# Patient Record
Sex: Female | Born: 1935
Health system: Southern US, Community
[De-identification: ages and names within clinical notes are randomized; demographics above are authoritative.]

## PROBLEM LIST (undated history)

## (undated) DIAGNOSIS — M8580 Other specified disorders of bone density and structure, unspecified site: Secondary | ICD-10-CM

## (undated) DIAGNOSIS — C4490 Unspecified malignant neoplasm of skin, unspecified: Secondary | ICD-10-CM

## (undated) DIAGNOSIS — E785 Hyperlipidemia, unspecified: Secondary | ICD-10-CM

## (undated) DIAGNOSIS — IMO0001 Reserved for inherently not codable concepts without codable children: Secondary | ICD-10-CM

## (undated) DIAGNOSIS — I471 Supraventricular tachycardia, unspecified: Secondary | ICD-10-CM

## (undated) DIAGNOSIS — I10 Essential (primary) hypertension: Secondary | ICD-10-CM

## (undated) HISTORY — DX: Reserved for inherently not codable concepts without codable children: IMO0001

## (undated) HISTORY — PX: OTHER SURGICAL HISTORY: SHX169

## (undated) HISTORY — DX: Essential (primary) hypertension: I10

## (undated) HISTORY — DX: Hyperlipidemia, unspecified: E78.5

## (undated) HISTORY — DX: Supraventricular tachycardia, unspecified: I47.10

## (undated) HISTORY — DX: Unspecified malignant neoplasm of skin, unspecified: C44.90

## (undated) HISTORY — PX: CATARACT EXTRACTION: SUR2

## (undated) HISTORY — DX: Other specified disorders of bone density and structure, unspecified site: M85.80

## (undated) HISTORY — DX: Supraventricular tachycardia: I47.1

---
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
## 1997-12-04 ENCOUNTER — Other Ambulatory Visit: Admission: RE | Admit: 1997-12-04 | Discharge: 1997-12-04 | Payer: Self-pay | Admitting: *Deleted

## 1998-07-17 ENCOUNTER — Encounter: Payer: Self-pay | Admitting: *Deleted

## 1998-07-17 ENCOUNTER — Ambulatory Visit (HOSPITAL_COMMUNITY): Admission: RE | Admit: 1998-07-17 | Discharge: 1998-07-17 | Payer: Self-pay | Admitting: *Deleted

## 1998-12-05 ENCOUNTER — Other Ambulatory Visit: Admission: RE | Admit: 1998-12-05 | Discharge: 1998-12-05 | Payer: Self-pay | Admitting: *Deleted

## 1999-07-21 ENCOUNTER — Ambulatory Visit (HOSPITAL_COMMUNITY): Admission: RE | Admit: 1999-07-21 | Discharge: 1999-07-21 | Payer: Self-pay | Admitting: *Deleted

## 2000-08-10 ENCOUNTER — Encounter (INDEPENDENT_AMBULATORY_CARE_PROVIDER_SITE_OTHER): Payer: Self-pay | Admitting: Specialist

## 2000-08-10 ENCOUNTER — Ambulatory Visit (HOSPITAL_COMMUNITY): Admission: RE | Admit: 2000-08-10 | Discharge: 2000-08-10 | Payer: Self-pay | Admitting: Gastroenterology

## 2000-08-12 ENCOUNTER — Encounter: Payer: Self-pay | Admitting: Family Medicine

## 2000-08-12 ENCOUNTER — Ambulatory Visit (HOSPITAL_COMMUNITY): Admission: RE | Admit: 2000-08-12 | Discharge: 2000-08-12 | Payer: Self-pay | Admitting: Family Medicine

## 2001-08-15 ENCOUNTER — Encounter: Payer: Self-pay | Admitting: Family Medicine

## 2001-08-15 ENCOUNTER — Ambulatory Visit (HOSPITAL_COMMUNITY): Admission: RE | Admit: 2001-08-15 | Discharge: 2001-08-15 | Payer: Self-pay | Admitting: Family Medicine

## 2007-10-05 ENCOUNTER — Encounter: Admission: RE | Admit: 2007-10-05 | Discharge: 2008-01-03 | Payer: Self-pay | Admitting: Family Medicine

## 2009-09-26 ENCOUNTER — Encounter: Admission: RE | Admit: 2009-09-26 | Discharge: 2009-10-28 | Payer: Self-pay | Admitting: Orthopedic Surgery

## 2010-07-04 NOTE — Procedures (Signed)
[facility]  Patient:    Linda Gates, Linda Gates                  MRN: [id number] Proc. Date: 08/10/00 Adm. Date:  [id number] Attending:  Louie Bun CC:         Dellis Anes. Idell Pickles, M.D.   Procedure Report  PROCEDURE:  Colonoscopy with polypectomy.  ENDOSCOPIST:  Everardo All. Madilyn Fireman, M.D.  INDICATIONS:  Colon cancer screening in a 77 year old patient with no previous colon imaging.  DESCRIPTION OF PROCEDURE:  The patient was placed in the left lateral decubitus position and placed on the pulse monitor with continuous low flow oxygen delivered by nasal cannula.  She was sedated with 60 mg of IV Demerol and 6 mg of IV Versed.  The Olympus video colonoscope was inserted into the rectum and advanced to the cecum, confirmed by transillumination of McBurneys point and visualization of the ileocecal valve and appendiceal orifice.  The prep was fairly good.  The cecum, ascending and transverse colon appeared normal with no masses, polyps, diverticula or other mucosal abnormalities. Within the descending colon, we saw a flat 6 mm sessile polyp with was fulgurated by hot biopsy.  The remainder of the descending colon revealed no other abnormalities.  Within the sigmoid colon there was seen a few sigmoid diverticula and no other abnormalities.  The rectum appeared normal, and retroflexed view of the anus revealed no obviously enlarged internal hemorrhoids.  The colonoscope was then withdrawn, and the patient returned to the recovery room in stable condition.  She tolerated the procedure well, and there were no immediate complications.  IMPRESSION: 1. Single small descending colon polyp. 2. Left-sided diverticulosis.  PLAN:  Await histology to determine and interval for future colon screening.   and descending colon all appeared normal with no masses, polyps, diverticula or other mucosal abnormalities.  In the sigmoid colon there were seen several scattered  diverticula, no other abnormalities.  The rectum appeared normal and retroflexed view of the anus revealed no obvious internal hemorrhoids. The colonoscope was then withdrawn, and the patient returned to the recovery room in stable condition.  He tolerated the procedure well.  There were no immediate complications.  IMPRESSION:  Diverticulosis, otherwise normal colonoscopy.DD:  08/10/00 TD:  08/10/00 Job: [id number]

## 2013-01-05 ENCOUNTER — Telehealth: Payer: Self-pay

## 2013-01-05 MED ORDER — LISINOPRIL 5 MG PO TABS: 5.0000 mg | ORAL_TABLET | Freq: Every day | ORAL | Status: DC

## 2013-01-05 NOTE — Telephone Encounter (Signed)
Rx sent in for pt.

## 2013-01-07 ENCOUNTER — Encounter: Payer: Self-pay | Admitting: *Deleted

## 2013-01-07 ENCOUNTER — Encounter: Payer: Self-pay | Admitting: Cardiology

## 2013-01-07 DIAGNOSIS — I471 Supraventricular tachycardia: Secondary | ICD-10-CM | POA: Insufficient documentation

## 2013-01-07 DIAGNOSIS — IMO0001 Reserved for inherently not codable concepts without codable children: Secondary | ICD-10-CM | POA: Insufficient documentation

## 2013-01-07 DIAGNOSIS — I10 Essential (primary) hypertension: Secondary | ICD-10-CM | POA: Insufficient documentation

## 2013-01-07 DIAGNOSIS — M8580 Other specified disorders of bone density and structure, unspecified site: Secondary | ICD-10-CM | POA: Insufficient documentation

## 2013-01-07 DIAGNOSIS — C4490 Unspecified malignant neoplasm of skin, unspecified: Secondary | ICD-10-CM | POA: Insufficient documentation

## 2013-01-07 DIAGNOSIS — E785 Hyperlipidemia, unspecified: Secondary | ICD-10-CM | POA: Insufficient documentation

## 2013-01-10 ENCOUNTER — Ambulatory Visit: Payer: Self-pay | Admitting: Cardiology

## 2013-01-16 ENCOUNTER — Other Ambulatory Visit: Payer: Self-pay | Admitting: *Deleted

## 2013-01-16 ENCOUNTER — Encounter: Payer: Self-pay | Admitting: Cardiology

## 2013-01-16 ENCOUNTER — Ambulatory Visit (INDEPENDENT_AMBULATORY_CARE_PROVIDER_SITE_OTHER): Payer: Medicare Other | Admitting: Cardiology

## 2013-01-16 VITALS — BP 166/78 | HR 64 | Ht 62.0 in | Wt 150.0 lb

## 2013-01-16 DIAGNOSIS — I471 Supraventricular tachycardia: Secondary | ICD-10-CM

## 2013-01-16 DIAGNOSIS — I498 Other specified cardiac arrhythmias: Secondary | ICD-10-CM

## 2013-01-16 DIAGNOSIS — I10 Essential (primary) hypertension: Secondary | ICD-10-CM

## 2013-01-16 NOTE — Progress Notes (Signed)
  [address] [place], [place]  [address] Phone: [phone] Fax:  [phone]  Date:  01/16/2013   ID:  Linda Gates, DOB September 09, 1929, MRN [id number]  PCP:  Gaye Alken, MD  Cardiologist:  Armanda Magic, MD     History of Present Illness: Linda Gates is a 79 y.o. female with a history of SVT and HTN who presents today for followup.  She denies any rapid heart beats but is aware of her heart beat at night at times.  She denies any chest pain, SOB, DOE, LE edema, dizziness or syncope.   Wt Readings from Last 3 Encounters:  01/16/13 150 lb (68.04 kg)     Past Medical History  Diagnosis Date  . HTN (hypertension)   . Paroxysmal supraventricular tachycardia   . Myalgia and myositis, unspecified   . Osteopenia   . SVT (supraventricular tachycardia)   . Dyslipidemia   . Skin cancer   . Hyperlipidemia     Current Outpatient Prescriptions  Medication Sig Dispense Refill  . Biotin 5000 MCG CAPS Take by mouth daily.      Marland Kitchen aspirin 81 MG tablet Take 81 mg by mouth daily.      . Cholecalciferol (VITAMIN D) 400 UNITS capsule Take 400 Units by mouth daily.      Marland Kitchen diltiazem (DILACOR XR) 240 MG 24 hr capsule Take 240 mg by mouth daily.      Marland Kitchen lisinopril (PRINIVIL,ZESTRIL) 5 MG tablet Take 1 tablet (5 mg total) by mouth daily.  30 tablet  6  . metoprolol (LOPRESSOR) 50 MG tablet Take 50 mg by mouth 2 (two) times daily.      . Omega-3 Fatty Acids (FISH OIL PO) Take 1 tablet by mouth daily.      . vitamin C (ASCORBIC ACID) 500 MG tablet Take 500 mg by mouth daily.       No current facility-administered medications for this visit.    Allergies:   Allergies not on file  Social History:  The patient  reports that she has never smoked. She does not have any smokeless tobacco history on file. She reports that she does not drink alcohol or use illicit drugs.   Family History:  The patient's family history includes Heart attack in her mother; Heart  disease in her father; Hypertension in her father.   ROS:  Please see the history of present illness.      All other systems reviewed and negative.   PHYSICAL EXAM: VS:  BP 166/78  Pulse 64  Ht 5\' 2"  (1.575 m)  Wt 150 lb (68.04 kg)  BMI 27.43 kg/m2 Well nourished, well developed, in no acute distress HEENT: normal Neck: no JVD Cardiac:  normal S1, S2; RRR; no murmur Lungs:  clear to auscultation bilaterally, no wheezing, rhonchi or rales Abd: soft, nontender, no hepatomegaly Ext: no edema Skin: warm and dry Neuro:  CNs 2-12 intact, no focal abnormalities noted       ASSESSMENT AND PLAN:  1. SVT with no rapid heart beats   - continue Diltiazem/metoprolol  2. HTN with elevated BP today - she brings in her BP readings today which range from 104-138/45-55mmHg.    - will continue diltiazem/metoprolol/lisinopril  Followup with me in 1 year  Signed, Armanda Magic, MD 01/16/2013 8:53 AM

## 2013-01-16 NOTE — Patient Instructions (Signed)
Your physician recommends that you continue on your current medications as directed. Please refer to the Current Medication list given to you today.  Your physician wants you to follow-up in: 1 Year with Dr Turner You will receive a reminder letter in the mail two months in advance. If you don't receive a letter, please call our office to schedule the follow-up appointment.  

## 2013-07-19 ENCOUNTER — Other Ambulatory Visit: Payer: Self-pay | Admitting: Family Medicine

## 2013-07-19 ENCOUNTER — Ambulatory Visit
Admission: RE | Admit: 2013-07-19 | Discharge: 2013-07-19 | Disposition: A | Discharge status: Home / Self Care | Payer: Medicare Other | Source: Ambulatory Visit | Attending: Family Medicine | Admitting: Family Medicine

## 2013-07-19 DIAGNOSIS — R609 Edema, unspecified: Secondary | ICD-10-CM

## 2013-07-19 DIAGNOSIS — M79604 Pain in right leg: Secondary | ICD-10-CM

## 2014-01-17 ENCOUNTER — Ambulatory Visit (INDEPENDENT_AMBULATORY_CARE_PROVIDER_SITE_OTHER): Payer: Medicare Other | Admitting: Cardiology

## 2014-01-17 ENCOUNTER — Encounter: Payer: Self-pay | Admitting: Cardiology

## 2014-01-17 VITALS — BP 150/72 | HR 62 | Ht 62.0 in | Wt 151.0 lb

## 2014-01-17 DIAGNOSIS — I471 Supraventricular tachycardia: Secondary | ICD-10-CM

## 2014-01-17 DIAGNOSIS — R002 Palpitations: Secondary | ICD-10-CM

## 2014-01-17 DIAGNOSIS — I10 Essential (primary) hypertension: Secondary | ICD-10-CM

## 2014-01-17 NOTE — Progress Notes (Signed)
[facility] [place] [place], [place]  [address] Phone: [phone] Fax:  [phone]  Date:  01/17/2014   ID:  Linda Gates, DOB [birth year], MRN [id number]  PCP:  Gerrit Heck, MD  Cardiologist:  Fransico Him, MD    History of Present Illness: Linda Gates is a 80 y.o. female with a history of SVT and HTN who presents today for followup. She denies any rapid heart beats but is aware of her heart beating hard and this occurs both at night and during the day. She denies any chest pain, SOB, DOE, dizziness or syncope.  She has had some RLE edema after pulling a muscle which has resolved.    Wt Readings from Last 3 Encounters:  01/17/14 151 lb (68.493 kg)  01/16/13 150 lb (68.04 kg)     Past Medical History  Diagnosis Date  . HTN (hypertension)   . Paroxysmal supraventricular tachycardia   . Myalgia and myositis, unspecified   . Osteopenia   . SVT (supraventricular tachycardia)   . Dyslipidemia   . Skin cancer   . Hyperlipidemia     Current Outpatient Prescriptions  Medication Sig Dispense Refill  . aspirin 81 MG tablet Take 81 mg by mouth daily.    . Biotin 5000 MCG CAPS Take by mouth daily.    . Cholecalciferol (VITAMIN D) 400 UNITS capsule Take 400 Units by mouth daily.    Marland Kitchen diltiazem (DILACOR XR) 240 MG 24 hr capsule Take 240 mg by mouth daily.    Marland Kitchen losartan (COZAAR) 25 MG tablet Take 25 mg by mouth. Take half a tablet daily    . metoprolol (LOPRESSOR) 50 MG tablet Take 50 mg by mouth 2 (two) times daily.    . Omega-3 Fatty Acids (FISH OIL PO) Take 1 tablet by mouth daily.    . vitamin B-12 (CYANOCOBALAMIN) 1000 MCG tablet Take 1,000 mcg by mouth daily.    . vitamin C (ASCORBIC ACID) 500 MG tablet Take 500 mg by mouth daily.     No current facility-administered medications for this visit.    Allergies:   No Known Allergies  Social History:  The patient  reports that she has never smoked. She does not have any smokeless tobacco  history on file. She reports that she does not drink alcohol or use illicit drugs.   Family History:  The patient's family history includes Heart attack in her mother; Heart disease in her father; Hypertension in her father.   ROS:  Please see the history of present illness.      All other systems reviewed and negative.   PHYSICAL EXAM: VS:  BP 150/72 mmHg  Pulse 62  Ht 5\' 2"  (1.575 m)  Wt 151 lb (68.493 kg)  BMI 27.61 kg/m2 Well nourished, well developed, in no acute distress HEENT: normal Neck: no JVD Cardiac:  normal S1, S2; RRR; no murmur Lungs:  clear to auscultation bilaterally, no wheezing, rhonchi or rales Abd: soft, nontender, no hepatomegaly Ext: no edema Skin: warm and dry Neuro:  CNs 2-12 intact, no focal abnormalities noted  EKG:     NSR with HR 62bpm with no ST changes  ASSESSMENT AND PLAN:  1. SVT with no rapid heart beats but has hard pounding during the day and at night.    - I will get an event monitor to assess hard beats further and make sure she is not having PAF - continue Diltiazem/metoprolol    2.  HTN - borderline control.  At home her BP runs 111-141/60's - will continue diltiazem/metoprolol/losartan  Followup with me in 1 year   Signed, Fransico Him, MD [facility] 01/17/2014 3:13 PM

## 2014-01-17 NOTE — Patient Instructions (Addendum)
Your physician has recommended that you wear an event monitor. Event monitors are medical devices that record the heart's electrical activity. Doctors most often us these monitors to diagnose arrhythmias. Arrhythmias are problems with the speed or rhythm of the heartbeat. The monitor is a small, portable device. You can wear one while you do your normal daily activities. This is usually used to diagnose what is causing palpitations/syncope (passing out).  Your physician recommends that you continue on your current medications as directed. Please refer to the Current Medication list given to you today.  Your physician wants you to follow-up in: 1 year with Dr. Turner. You will receive a reminder letter in the mail two months in advance. If you don't receive a letter, please call our office to schedule the follow-up appointment.  

## 2014-01-23 ENCOUNTER — Encounter (INDEPENDENT_AMBULATORY_CARE_PROVIDER_SITE_OTHER): Payer: Medicare Other

## 2014-01-23 ENCOUNTER — Encounter: Payer: Self-pay | Admitting: *Deleted

## 2014-01-23 DIAGNOSIS — R002 Palpitations: Secondary | ICD-10-CM

## 2014-01-23 NOTE — Progress Notes (Signed)
Patient ID: Linda Gates, female   DOB: Aug 27, 1929, 80 y.o.   MRN: [id number] Lifewatch 30 day cardiac event monitor applied to patient.

## 2014-01-26 ENCOUNTER — Telehealth: Payer: Self-pay | Admitting: Cardiology

## 2014-01-26 NOTE — Telephone Encounter (Signed)
To [place].

## 2014-01-26 NOTE — Telephone Encounter (Signed)
New Prob    Calling to notify office pt will be removing her monitor early as she feels device is not useful for her.

## 2014-01-30 NOTE — Telephone Encounter (Signed)
Follow up      Pt sent monitor back because she could not get it to work.  Talk to the nurse to find out what to do now

## 2014-01-31 NOTE — Telephone Encounter (Signed)
Follow up ° °Pt returned call  °

## 2014-01-31 NOTE — Telephone Encounter (Signed)
Patient reports she could not correctly utilize the monitor so she shipped it back.  To Valetta Fuller and Darrick Penna so report may be pulled.

## 2014-01-31 NOTE — Telephone Encounter (Signed)
Left message to call back  

## 2014-02-08 ENCOUNTER — Telehealth: Payer: Self-pay | Admitting: Cardiology

## 2014-02-08 NOTE — Telephone Encounter (Signed)
Linda Gates is calling due to sensation of feeling her heart beat hard in her chest and due to elevated BP. States that the problem is similar to what she was complaining about with Dr. Radford Pax at last visit but workup has been delayed due to her inability to use the lifewatch. States that rate on BP cuff is only 68. No red flag symptoms of syncope, chest pain, rapid heart rate that would necessitate ER visit. FYI to nursing for follow up.

## 2014-02-08 NOTE — Telephone Encounter (Signed)
Received message from Dr. Elias Else from overnight conversation with pt.  When I spoke w/her this AM she states she is feeling better. Palpitations are much better.  BP is 183/77 HR 69.  States BP was over 190 early this morning.  States she had a monitor but "didn't work for her so I sent it back".  She wants to know if can take extra Losartan if BP continues to be elevated.  States her BP is normally 111-140/ and HR usually around 62-65. Spoke w/Danya Dunn,PA.  She suggests that she take Metoprolol 75 mg in AM and 50 mg in PM until seen by Dr. Radford Pax or PA.  Advised to continue to monitor BP and if BP continues to be elevated over 140/  to call back.  Also advised to call first of week to see if Dr. Radford Pax or PA could see in near future. She verbalizes understanding and will continue to monitor BP.

## 2014-02-12 ENCOUNTER — Telehealth: Payer: Self-pay | Admitting: Cardiology

## 2014-02-13 ENCOUNTER — Encounter: Payer: Self-pay | Admitting: Cardiology

## 2014-02-13 ENCOUNTER — Ambulatory Visit (INDEPENDENT_AMBULATORY_CARE_PROVIDER_SITE_OTHER): Payer: Medicare Other | Admitting: Cardiology

## 2014-02-13 VITALS — BP 210/98 | HR 64 | Ht 62.0 in | Wt 150.8 lb

## 2014-02-13 DIAGNOSIS — R002 Palpitations: Secondary | ICD-10-CM

## 2014-02-13 DIAGNOSIS — I10 Essential (primary) hypertension: Secondary | ICD-10-CM

## 2014-02-13 MED ORDER — LOSARTAN POTASSIUM 25 MG PO TABS: 25.0000 mg | ORAL_TABLET | Freq: Every day | ORAL | Status: DC

## 2014-02-13 NOTE — Assessment & Plan Note (Signed)
Repeat B/P by me 210/92

## 2014-02-13 NOTE — Assessment & Plan Note (Signed)
She describes "hard heart beat at night" not fast or irregular

## 2014-02-13 NOTE — Addendum Note (Signed)
Addended by: Lamar Laundry on: 02/13/2014 11:05 AM   Modules accepted: Orders

## 2014-02-13 NOTE — Patient Instructions (Addendum)
Your physician has recommended you make the following change in your medication:  1) INCREASE Cozaar to 25mg  daily  Your physician recommends that you return for lab work in: 1 week ( Bmet)  Your physician recommends that you schedule a follow-up appointment in: 1 week with a nurse for a BP check. Please bring your BP machine with you that day   Your physician recommends that you schedule a follow-up appointment in: 3 months with Dr.Turner

## 2014-02-13 NOTE — Progress Notes (Signed)
02/13/2014 Linda Gates   12/21/1929  [id number]  Primary Physician Gerrit Heck, MD Primary Cardiologist: Dr Radford Pax  HPI:  Pleasant 80 y/o female with a past history of HTN and PSVT. The pt tells me Linda Gates has had palpitations "formany years". She has had B/P issues only since her husband died a few years ago. She lives alone but had a daughter 30 minutes away. The pt saw Dr Radford Pax 01/23/14 and mentioned that she had "hard heart beats" mainly at night. A monitor was ordered but the pt only wore it a couple of days and it showed no significant arrhythmia. Christmas eve she had hard heart beats and high B/P. She called the on call MD and was placed on my schedule today. She denies chest pain or dyspnea.   Current Outpatient Prescriptions  Medication Sig Dispense Refill  . aspirin 81 MG tablet Take 81 mg by mouth daily.    . Biotin 5000 MCG CAPS Take by mouth daily.    . Cholecalciferol (VITAMIN D) 400 UNITS capsule Take 400 Units by mouth daily.    Marland Kitchen diltiazem (DILACOR XR) 240 MG 24 hr capsule Take 240 mg by mouth daily.    Marland Kitchen losartan (COZAAR) 25 MG tablet Take 25 mg by mouth. Take half a tablet daily    . metoprolol (LOPRESSOR) 50 MG tablet Take 50 mg by mouth 2 (two) times daily. 1 1/2 tablets in the morning and 1 tablet in the evening    . Omega-3 Fatty Acids (FISH OIL PO) Take 1 tablet by mouth daily.    . vitamin B-12 (CYANOCOBALAMIN) 1000 MCG tablet Take 1,000 mcg by mouth daily.    . vitamin C (ASCORBIC ACID) 500 MG tablet Take 500 mg by mouth daily.     No current facility-administered medications for this visit.    No Known Allergies  History   Social History  . Marital Status: Married    Spouse Name: N/A    Number of Children: N/A  . Years of Education: N/A   Occupational History  . Not on file.   Social History Main Topics  . Smoking status: Never Smoker   . Smokeless tobacco: Not on file  . Alcohol Use: No  . Drug Use: No  . Sexual Activity:  Not on file   Other Topics Concern  . Not on file   Social History Narrative     Review of Systems: General: negative for chills, fever, night sweats or weight changes.  Cardiovascular: negative for chest pain, dyspnea on exertion, edema, orthopnea, palpitations, paroxysmal nocturnal dyspnea or shortness of breath Dermatological: negative for rash Respiratory: negative for cough or wheezing Urologic: negative for hematuria Abdominal: negative for nausea, vomiting, diarrhea, bright red blood per rectum, melena, or hematemesis Neurologic: negative for visual changes, syncope, or dizziness All other systems reviewed and are otherwise negative except as noted above.    Blood pressure 210/98, pulse 64, height 5\' 2"  (1.575 m), weight 150 lb 12.8 oz (68.402 kg).  General appearance: alert, cooperative and no distress Neck: no carotid bruit and no JVD Lungs: clear to auscultation bilaterally Heart: regular rate and rhythm Extremities: no edema  EKG NSR  ASSESSMENT AND PLAN:   Uncontrolled hypertension Repeat B/P by me 210/92  Palpitations She describes "hard heart beat at night" not fast or irregular   PLAN  Repeat B/P by me 210/92. She keeps a log of her B/P at home and her systolic ranges from 409-811. I suggested she increase  her Cozaar to 25 mg and take it Qhs. She'll return next week for a B/P check and a BMP. She'll bring her home B/P machine with her.  [name] KPA-C 02/13/2014 10:45 AM

## 2014-02-21 ENCOUNTER — Ambulatory Visit (INDEPENDENT_AMBULATORY_CARE_PROVIDER_SITE_OTHER): Payer: Medicare Other

## 2014-02-21 ENCOUNTER — Other Ambulatory Visit (INDEPENDENT_AMBULATORY_CARE_PROVIDER_SITE_OTHER): Payer: Medicare Other | Admitting: *Deleted

## 2014-02-21 ENCOUNTER — Telehealth: Payer: Self-pay | Admitting: Cardiology

## 2014-02-21 VITALS — BP 192/72 | HR 68 | Ht 62.0 in | Wt 151.0 lb

## 2014-02-21 DIAGNOSIS — R002 Palpitations: Secondary | ICD-10-CM

## 2014-02-21 DIAGNOSIS — I10 Essential (primary) hypertension: Secondary | ICD-10-CM

## 2014-02-21 LAB — BASIC METABOLIC PANEL
BUN: 14 mg/dL (ref 6–23)
CO2: 29 mEq/L (ref 19–32)
Calcium: 9.4 mg/dL (ref 8.4–10.5)
Chloride: 100 mEq/L (ref 96–112)
Creatinine, Ser: 0.6 mg/dL (ref 0.4–1.2)
GFR: 97.91 mL/min (ref >60.00)
Glucose, Bld: 100 mg/dL — ABNORMAL HIGH (ref 70–99)
Potassium: 4.5 mEq/L (ref 3.5–5.1)
Sodium: 132 mEq/L — ABNORMAL LOW (ref 135–145)

## 2014-02-21 MED ORDER — LOSARTAN POTASSIUM 25 MG PO TABS: 50.0000 mg | ORAL_TABLET | Freq: Every day | ORAL | Status: DC

## 2014-02-21 NOTE — Telephone Encounter (Signed)
Please let patient know that heart monitor was normal

## 2014-02-21 NOTE — Telephone Encounter (Signed)
Left message to call back  

## 2014-02-21 NOTE — Progress Notes (Signed)
1.) Reason for visit: 1 week f/u BP check  2.) Name of MD requesting visit: Kerin Ransom PA-C (This is Dr Theodosia Blender pt)         3.) H&P: at the pts last OV with Kerin Ransom, PA-C on 02/13/14 the pts BP was 210/92 so she was advised to increase Cozaar to 25 mg to be taken at qhs and a BP check with nurse was scheduled for today. Her BP this am  is 192/72. The pt bought her BP cuff with her to this BP check as directed and it was not working properly. It showed an error message several times then displayed a low reading on the pt of 80/42. I then checked my own BP with her cuff and got a reading of 72/34 which is incorrect as my BP is normal at 118/78 on our BP machine. The pt is advised to try new batteries in her BP cuff even though she states she just replaced them a couple weeks ago. She states that if new batteries does not help she will purchase a new one.  4.) ROS related to problem: the pt states that she has no complaints at this time but is concerned with elevated BP.  5.) Assessment and plan per MD: Per Dr Radford Pax the pt is advised to increase Cozaar to 50 mg qhs and to have another BP check in 1 week which is scheduled for 02/28/14. RX for Cozaar 50 mg # 60 with 5 refills sent to [facility] on [address] to fill per the pts request.  Signed: Fransico Him, MD [facility] 02/21/2014

## 2014-02-22 NOTE — Telephone Encounter (Signed)
Patient informed of normal monitor results and verbal understanding expressed.

## 2014-02-28 ENCOUNTER — Ambulatory Visit (INDEPENDENT_AMBULATORY_CARE_PROVIDER_SITE_OTHER): Payer: Medicare Other | Admitting: *Deleted

## 2014-02-28 VITALS — HR 62 | Resp 16

## 2014-02-28 DIAGNOSIS — Z136 Encounter for screening for cardiovascular disorders: Secondary | ICD-10-CM

## 2014-02-28 DIAGNOSIS — I10 Essential (primary) hypertension: Secondary | ICD-10-CM

## 2014-02-28 DIAGNOSIS — Z0130 Encounter for examination of blood pressure without abnormal findings: Secondary | ICD-10-CM

## 2014-02-28 NOTE — Progress Notes (Signed)
1.) Reason for visit: BP check  2.) Name of MD requesting visit: Kerin Ransom, PA-C  3.) H&P: patient with htn, OV 02/13/14 with Kerin Ransom BP 210/98, cozaar increased, returned for BP check 02/21/14 BP 192/72, cozaar increased to 50mg  q hs.    4.) ROS related to problem: returns today, brought new BP cuff to correlate.  Home cuff is 160/76, auscultation is 162/68.  Repeated after several minutes patient sitting quietly in exam room:  BP 148/62.   5.) Assessment and plan per MD: Discussed with Dr. Aundra Dubin, DOD.  No change recommended, review with Dr. Radford Pax who is in clinic later today.    Increase Losartan to 100mg  daily and have patient check BP daily for a week and call with results.  Please check BMET in 1 week  Signed: Fransico Him,  02/28/2014

## 2014-02-28 NOTE — Patient Instructions (Signed)
Your physician recommends that you continue on your current medications as directed. Please refer to the Current Medication list given to you today. Continue to monitor your blood pressure daily at home with your new blood pressure cuff.

## 2014-03-02 ENCOUNTER — Telehealth: Payer: Self-pay

## 2014-03-02 NOTE — Telephone Encounter (Signed)
Per Dr. Radford Pax, instructed patient to INCREASE losartan to 100 mg daily. Patient does not want to, stating her BP has fallen since her BP check.  She st she will keep a BP log for the weekend and call next Tuesday with results.

## 2014-03-06 NOTE — Telephone Encounter (Signed)
Patient calling to F/U with blood pressure readings from the weekend.  1/16: 121/59 1/17: 124/58 1/18: 144/63 1/19: 138/66  Patient st she will increase Losartan if Dr. Radford Pax wants her to but she does not feel it is necessary. She is currently still taking 50 mg Losartan daily.  To Dr. Radford Pax for review and recommendations.

## 2014-03-07 NOTE — Telephone Encounter (Signed)
BP is good.  Continue on Losartan 50mg  daily

## 2014-03-07 NOTE — Telephone Encounter (Signed)
Instructed patient to continue Losartan 50 mg daily. Patient agrees with treatment plan.

## 2014-05-07 NOTE — Progress Notes (Signed)
Cardiology Office Note   Date:  05/08/2014   ID:  Linda Gates, DOB [birth year], MRN [id number]  PCP:  Gerrit Heck, MD  Cardiologist:   Sueanne Margarita, MD   Chief Complaint  Patient presents with  . SVT  . Hypertension      History of Present Illness: Linda Gates is a 81 y.o. female with a history of SVT and HTN who presents today for followup. She denies any rapid heart beats but is aware of her heart beating hard and this occurs both at night and during the day. She wore a heart monitor in January that was normal.   She denies any chest pain, SOB, DOE, LE edema, dizziness or syncope.      Past Medical History  Diagnosis Date  . HTN (hypertension)   . Paroxysmal supraventricular tachycardia   . Myalgia and myositis, unspecified   . Osteopenia   . SVT (supraventricular tachycardia)   . Dyslipidemia   . Skin cancer   . Hyperlipidemia     Past Surgical History  Procedure Laterality Date  . Left shoulder surgery for impingement syndrome    . Cataract extraction    . Total abdominal hysterectomy  1958     Current Outpatient Prescriptions  Medication Sig Dispense Refill  . aspirin 81 MG tablet Take 81 mg by mouth daily.    . Biotin 5000 MCG CAPS Take by mouth daily.    . Cholecalciferol (VITAMIN D) 400 UNITS capsule Take 400 Units by mouth daily.    Marland Kitchen diltiazem (DILACOR XR) 240 MG 24 hr capsule Take 240 mg by mouth daily.    Marland Kitchen losartan (COZAAR) 25 MG tablet Take 2 tablets (50 mg total) by mouth daily. 60 tablet 5  . metoprolol (LOPRESSOR) 50 MG tablet Take 50 mg by mouth 2 (two) times daily. 1 1/2 tablets in the morning and 1 tablet in the evening    . Omega-3 Fatty Acids (FISH OIL PO) Take 1 tablet by mouth daily.    . vitamin B-12 (CYANOCOBALAMIN) 1000 MCG tablet Take 1,000 mcg by mouth daily.    . vitamin C (ASCORBIC ACID) 500 MG tablet Take 500 mg by mouth daily.     No current facility-administered medications for this visit.     Allergies:   Review of patient's allergies indicates no known allergies.    Social History:  The patient  reports that she has never smoked. She does not have any smokeless tobacco history on file. She reports that she does not drink alcohol or use illicit drugs.   Family History:  The patient's family history includes Heart attack in her mother; Heart disease in her father; Hypertension in her father.    ROS:  Please see the history of present illness.   Otherwise, review of systems are positive for none.   All other systems are reviewed and negative.    PHYSICAL EXAM: VS:  BP 150/78 mmHg  Pulse 68  Ht 5\' 2"  (1.575 m)  Wt 153 lb 12.8 oz (69.763 kg)  BMI 28.12 kg/m2  SpO2 95% , BMI Body mass index is 28.12 kg/(m^2). GEN: Well nourished, well developed, in no acute distress HEENT: normal Neck: no JVD, carotid bruits, or masses Cardiac: RRR; no murmurs, rubs, or gallops,no edema  Respiratory:  clear to auscultation bilaterally, normal work of breathing GI: soft, nontender, nondistended, + BS MS: no deformity or atrophy Skin: warm and dry, no rash Neuro:  Strength and sensation are intact Psych:  euthymic mood, full affect   EKG:  EKG is not ordered today.    Recent Labs: 02/21/2014: BUN 14; Creatinine 0.6; Potassium 4.5; Sodium 132*    Lipid Panel No results found for: CHOL, TRIG, HDL, CHOLHDL, VLDL, LDLCALC, LDLDIRECT    Wt Readings from Last 3 Encounters:  05/08/14 153 lb 12.8 oz (69.763 kg)  02/21/14 151 lb (68.493 kg)  02/13/14 150 lb 12.8 oz (68.402 kg)    ASSESSMENT AND PLAN: 1.   SVT - with no reoccurrence - continue Diltiazem/metoprolol 2.  HTN - borderline controlled. She brought in her BP readings from home which ranged from 121-160/50-60's.  She uses very little sodium and I have encouraged her to cut out added sodium in her diet.   - will continue diltiazem/metoprolol/losartan    - I have asked her to monitor her BP and  if is stays consistently in the 150/67mmHg range to call me   Current medicines are reviewed at length with the patient today.  The patient does not have concerns regarding medicines.  The following changes have been made:  no change  Labs/ tests ordered today include: None  No orders of the defined types were placed in this encounter.     Disposition:   FU with me in 1 year   Signed, Sueanne Margarita, MD  05/08/2014 11:20 AM    [facility] [address], [place], [place]  [address] Phone: [phone]; Fax: [phone]

## 2014-05-08 ENCOUNTER — Ambulatory Visit (INDEPENDENT_AMBULATORY_CARE_PROVIDER_SITE_OTHER): Payer: Medicare Other | Admitting: Cardiology

## 2014-05-08 ENCOUNTER — Encounter: Payer: Self-pay | Admitting: Cardiology

## 2014-05-08 VITALS — BP 150/78 | HR 68 | Ht 62.0 in | Wt 153.8 lb

## 2014-05-08 DIAGNOSIS — I471 Supraventricular tachycardia: Secondary | ICD-10-CM

## 2014-05-08 DIAGNOSIS — I10 Essential (primary) hypertension: Secondary | ICD-10-CM

## 2014-05-08 NOTE — Patient Instructions (Signed)
Your physician wants you to follow-up in: 1 year with Dr. Turner. You will receive a reminder letter in the mail two months in advance. If you don't receive a letter, please call our office to schedule the follow-up appointment.  

## 2014-06-11 NOTE — Telephone Encounter (Signed)
ERROR

## 2016-08-03 ENCOUNTER — Other Ambulatory Visit (HOSPITAL_BASED_OUTPATIENT_CLINIC_OR_DEPARTMENT_OTHER): Payer: Self-pay | Admitting: Family Medicine

## 2016-08-03 ENCOUNTER — Ambulatory Visit (HOSPITAL_BASED_OUTPATIENT_CLINIC_OR_DEPARTMENT_OTHER)
Admission: RE | Admit: 2016-08-03 | Discharge: 2016-08-03 | Disposition: A | Discharge status: Home / Self Care | Payer: Medicare Other | Source: Ambulatory Visit | Attending: Family Medicine | Admitting: Family Medicine

## 2016-08-03 DIAGNOSIS — M7989 Other specified soft tissue disorders: Secondary | ICD-10-CM

## 2018-03-24 ENCOUNTER — Ambulatory Visit: Payer: Medicare Other | Admitting: Cardiology

## 2018-04-12 ENCOUNTER — Encounter (INDEPENDENT_AMBULATORY_CARE_PROVIDER_SITE_OTHER): Payer: Self-pay

## 2018-04-12 ENCOUNTER — Encounter: Payer: Self-pay | Admitting: Cardiovascular Disease

## 2018-04-12 ENCOUNTER — Ambulatory Visit: Payer: Medicare Other | Admitting: Cardiovascular Disease

## 2018-04-12 DIAGNOSIS — I10 Essential (primary) hypertension: Secondary | ICD-10-CM

## 2018-04-12 DIAGNOSIS — E785 Hyperlipidemia, unspecified: Secondary | ICD-10-CM

## 2018-04-12 DIAGNOSIS — I471 Supraventricular tachycardia: Secondary | ICD-10-CM | POA: Diagnosis not present

## 2018-04-12 NOTE — Patient Instructions (Signed)
Medication Instructions:  Your physician recommends that you continue on your current medications as directed. Please refer to the Current Medication list given to you today. If you need a refill on your cardiac medications before your next appointment, please call your pharmacy.   Lab work: NONE If you have labs (blood work) drawn today and your tests are completely normal, you will receive your results only by: Marland Kitchen MyChart Message (if you have MyChart) OR . A paper copy in the mail If you have any lab test that is abnormal or we need to change your treatment, we will call you to review the results.  Testing/Procedures: NONE  Follow-Up: At [facility], you and your health needs are our priority.  As part of our continuing mission to provide you with exceptional heart care, we have created designated Provider Care Teams.  These Care Teams include your primary Cardiologist (physician) and Advanced Practice Providers (APPs -  Physician Assistants and Nurse Practitioners) who all work together to provide you with the care you need, when you need it. . You will need a follow up appointment in 3 months.  Please call our office 2 months in advance to schedule this appointment.  You may see Dr. Gwenlyn Found or one of the following Advanced Practice Providers on your designated Care Team:   . Kerin Ransom, [place] . Almyra Deforest, PA-C . Fabian Sharp, PA-C . Jory Sims, DNP . Rosaria Ferries, PA-C . Roby Lofts, PA-C . Sande Rives, PA-C

## 2018-04-12 NOTE — Assessment & Plan Note (Signed)
History of PSVT in the past which has been fairly quiesced sent.  Over last several weeks she is woken up with her chest pounding for unclear reasons along with hypertension.  She has had PACs and PVCs as well.

## 2018-04-12 NOTE — Assessment & Plan Note (Signed)
History of essential hypertension the blood pressure measured today at 158/64.  She is on losartan, metoprolol and Cardizem.  For the most part, her blood pressure log shows fairly normal blood pressures except for several times in the mornings when she has blood pressure spikes.

## 2018-04-12 NOTE — Assessment & Plan Note (Signed)
History of dyslipidemia not on statin therapy with recent lipid profile performed 08/09/2017 revealing a total cholesterol of 269, LDL of 169 and HDL of 60

## 2018-04-12 NOTE — Progress Notes (Signed)
04/12/2018 Linda Gates   04-28-1929  [id number]  Primary Physician Linda Ruff, MD Primary Cardiologist: Linda Harp MD Linda Gates, [place], [place]  HPI:  Linda Gates is a 85 y.o. mildly overweight widowed Caucasian female mother of 1 living child (1 deceased child), grandmother 5 grandchildren referred by Dr. Drema Gates for ongoing cardiovascular care because of history of PSVT and hypertension.  She was previously a patient of Dr. Thurman Gates.  She last saw Dr. Wynonia Gates a year ago.  She does have a history of treated hypertension and untreated hyperlipidemia.  She is had PSVT remotely and palpitations with PACs and PVCs.  She has no other cardiac risk factors other than a mother who died of a myocardial infarction at age 35.  She is never had a heart attack or stroke.  She denies chest pain or shortness of breath.  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   Current Meds  Medication Sig  . aspirin 81 MG tablet Take 81 mg by mouth daily.  . Calcium 600-200 MG-UNIT tablet Take 1 tablet by mouth daily.  . Cholecalciferol (VITAMIN D) 400 UNITS capsule Take 400 Units by mouth daily.  Marland Kitchen diltiazem (CARDIZEM CD) 240 MG 24 hr capsule TAKE 1 CAPSULE BY MOUTH ONCE EVERY DAY  . diltiazem (DILACOR XR) 240 MG 24 hr capsule Take 240 mg by mouth daily.  Marland Kitchen loratadine (CLARITIN) 10 MG tablet Take 10 mg by mouth daily.  Marland Kitchen losartan (COZAAR) 25 MG tablet Take 2 tablets (50 mg total) by mouth daily.  . metoprolol (LOPRESSOR) 50 MG tablet Take 50 mg by mouth 2 (two) times daily. 1 1/2 tablets in the morning and 1 tablet in the evening  . Probiotic Product (UP4 PROBIOTICS PO) Take by mouth.  . trimethoprim (TRIMPEX) 100 MG tablet Take 100 mg by mouth daily.  . vitamin C (ASCORBIC ACID) 500 MG tablet Take 500 mg by mouth daily.  . [DISCONTINUED] Biotin 5000 MCG CAPS Take by mouth daily.  . [DISCONTINUED] Omega-3 Fatty Acids (FISH OIL PO) Take 1 tablet by  mouth daily.  . [DISCONTINUED] vitamin B-12 (CYANOCOBALAMIN) 1000 MCG tablet Take 1,000 mcg by mouth daily.     No Known Allergies  Social History   Socioeconomic History  . Marital status: Widowed    Spouse name: Not on file  . Number of children: Not on file  . Years of education: Not on file  . Highest education level: Not on file  Occupational History  . Not on file  Social Needs  . Financial resource strain: Not on file  . Food insecurity:    Worry: Not on file    Inability: Not on file  . Transportation needs:    Medical: Not on file    Non-medical: Not on file  Tobacco Use  . Smoking status: Never Smoker  . Smokeless tobacco: Never Used  Substance and Sexual Activity  . Alcohol use: No  . Drug use: No  . Sexual activity: Not on file  Lifestyle  . Physical activity:    Days per week: Not on file    Minutes per session: Not on file  . Stress: Not on file  Relationships  . Social connections:    Talks on phone: Not on file    Gets together: Not on file    Attends religious service: Not on file    Active member of club or organization: Not on file  Attends meetings of clubs or organizations: Not on file    Relationship status: Not on file  . Intimate partner violence:    Fear of current or ex partner: Not on file    Emotionally abused: Not on file    Physically abused: Not on file    Forced sexual activity: Not on file  Other Topics Concern  . Not on file  Social History Narrative  . Not on file     Review of Systems: General: negative for chills, fever, night sweats or weight changes.  Cardiovascular: negative for chest pain, dyspnea on exertion, edema, orthopnea, palpitations, paroxysmal nocturnal dyspnea or shortness of breath Dermatological: negative for rash Respiratory: negative for cough or wheezing Urologic: negative for hematuria Abdominal: negative for nausea, vomiting, diarrhea, bright red blood per rectum, melena, or  hematemesis Neurologic: negative for visual changes, syncope, or dizziness All other systems reviewed and are otherwise negative except as noted above.    Blood pressure (!) 158/64, pulse (!) 52, height 5\' 2"  (1.575 m), weight 155 lb (70.3 kg).  General appearance: alert and no distress Neck: no adenopathy, no carotid bruit, no JVD, supple, symmetrical, trachea midline and thyroid not enlarged, symmetric, no tenderness/mass/nodules Lungs: clear to auscultation bilaterally Heart: regular rate and rhythm, S1, S2 normal, no murmur, click, rub or gallop Extremities: extremities normal, atraumatic, no cyanosis or edema Pulses: 2+ and symmetric Skin: Skin color, texture, turgor normal. No rashes or lesions Neurologic: Alert and oriented X 3, normal strength and tone. Normal symmetric reflexes. Normal coordination and gait  EKG not performed today  ASSESSMENT AND PLAN:   Benign essential HTN History of essential hypertension the blood pressure measured today at 158/64.  She is on losartan, metoprolol and Cardizem.  For the most part, her blood pressure log shows fairly normal blood pressures except for several times in the mornings when she has blood pressure spikes.  Paroxysmal supraventricular tachycardia (HCC) History of PSVT in the past which has been fairly quiesced sent.  Over last several weeks she is woken up with her chest pounding for unclear reasons along with hypertension.  She has had PACs and PVCs as well.  Dyslipidemia History of dyslipidemia not on statin therapy with recent lipid profile performed 08/09/2017 revealing a total cholesterol of 269, LDL of 169 and HDL of [place] MD [facility], [facility] 04/12/2018 3:04 PM

## 2018-06-28 ENCOUNTER — Telehealth (INDEPENDENT_AMBULATORY_CARE_PROVIDER_SITE_OTHER): Payer: Medicare Other | Admitting: Cardiovascular Disease

## 2018-06-28 ENCOUNTER — Encounter: Payer: Self-pay | Admitting: Cardiovascular Disease

## 2018-06-28 ENCOUNTER — Telehealth: Payer: Self-pay

## 2018-06-28 DIAGNOSIS — I10 Essential (primary) hypertension: Secondary | ICD-10-CM | POA: Diagnosis not present

## 2018-06-28 DIAGNOSIS — E785 Hyperlipidemia, unspecified: Secondary | ICD-10-CM

## 2018-06-28 DIAGNOSIS — I471 Supraventricular tachycardia: Secondary | ICD-10-CM | POA: Diagnosis not present

## 2018-06-28 NOTE — Progress Notes (Signed)
Virtual Visit via Telephone Note   This visit type was conducted due to national recommendations for restrictions regarding the COVID-19 Pandemic (e.g. social distancing) in an effort to limit this patient's exposure and mitigate transmission in our community.  Due to her co-morbid illnesses, this patient is at least at moderate risk for complications without adequate follow up.  This format is felt to be most appropriate for this patient at this time.  The patient did not have access to video technology/had technical difficulties with video requiring transitioning to audio format only (telephone).  All issues noted in this document were discussed and addressed.  No physical exam could be performed with this format.  Please refer to the patient's chart for her  consent to telehealth for [facility]   Date:  06/28/2018   ID:  [name], DOB 1929/10/08, MRN [id number]  Patient Location: Home Provider Location: Home  PCP:  Leighton Ruff, MD  Cardiologist: Dr. Quay Burow Electrophysiologist:  None   Evaluation Performed:  Follow-Up Visit  Chief Complaint: Follow-up hypertension and PSVT  History of Present Illness:     Linda Gates is a 85 y.o. mildly overweight widowed Caucasian female mother of 1 living child (1 deceased child), grandmother 5 grandchildren referred by Dr. Drema Dallas for ongoing cardiovascular care because of history of PSVT and hypertension.  I last saw her in the office 04/12/2018 She was previously a patient of Dr. Thurman Coyer.  She last saw Dr. Wynonia Lawman a year ago.  She does have a history of treated hypertension and untreated hyperlipidemia.  She is had PSVT remotely and palpitations with PACs and PVCs.  She has no other cardiac risk factors other than a mother who died of a myocardial infarction at age 35.  She is never had a heart attack or stroke.  She denies chest pain or shortness of breath.  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  Since I saw her in the office 3 months ago she is remained stable.  She is sheltering in place in an assisted care facility.  She is asymptomatic specifically denying chest pain, shortness of breath or palpitations.  The patient does not have symptoms concerning for COVID-19 infection (fever, chills, cough, or new shortness of breath).    Past Medical History:  Diagnosis Date   Dyslipidemia    HTN (hypertension)    Hyperlipidemia    Myalgia and myositis, unspecified    Osteopenia    Paroxysmal supraventricular tachycardia (HCC)    Skin cancer    Past Surgical History:  Procedure Laterality Date   CATARACT EXTRACTION     left shoulder surgery for impingement syndrome     TOTAL ABDOMINAL HYSTERECTOMY  1958     Current Meds  Medication Sig   Calcium 600-200 MG-UNIT tablet Take 1 tablet by mouth daily.   Cholecalciferol (VITAMIN D) 400 UNITS capsule Take 400 Units by mouth daily.   diltiazem (CARDIZEM CD) 240 MG 24 hr capsule TAKE 1 CAPSULE BY MOUTH ONCE EVERY DAY   losartan (COZAAR) 50 MG tablet Take 50 mg by mouth daily.   metoprolol (LOPRESSOR) 50 MG tablet Take 50 mg by mouth 2 (two) times daily. 1 1/2 tablets in the morning and 1 tablet in the evening   Probiotic Product (UP4 PROBIOTICS PO) Take by mouth.   trimethoprim (TRIMPEX) 100 MG tablet Take 100 mg by mouth daily.     Allergies:   Patient has no known  allergies.   Social History   Tobacco Use   Smoking status: Never Smoker   Smokeless tobacco: Never Used  Substance Use Topics   Alcohol use: No   Drug use: No     Family Hx: The patient's family history includes Heart attack in her mother; Heart disease in her father; Hypertension in her father.  ROS:   Please see the history of present illness.     All other systems reviewed and are negative.   Prior CV studies:   The following studies were reviewed today:  None  Labs/Other Tests and Data Reviewed:     EKG:  No ECG reviewed.  Recent Labs: No results found for requested labs within last 8760 hours.   Recent Lipid Panel No results found for: CHOL, TRIG, HDL, CHOLHDL, LDLCALC, LDLDIRECT  Wt Readings from Last 3 Encounters:  06/28/18 156 lb (70.8 kg)  04/12/18 155 lb (70.3 kg)  05/08/14 153 lb 12.8 oz (69.8 kg)     Objective:    Vital Signs:  BP (!) 141/69    Pulse 60    Ht 5\' 2"  (1.575 m)    Wt 156 lb (70.8 kg)    BMI 28.53 kg/m    VITAL SIGNS:  reviewed a physical exam was not performed since this was a telemedicine virtual telephone visit.  ASSESSMENT & PLAN:    1. Essential hypertension- history of essential hypertension her blood pressure measured today at 141/69.  She is on metoprolol, Cardizem and losartan. 2. History of hyperlipidemia not on statin therapy with lipid profile performed 08/09/2017 revealing a total cholesterol of 269, LDL of 169 and HDL of 60. 3. PSVT- no episodes since I saw her 3 months ago on metoprolol and Cardizem.  COVID-19 Education: The signs and symptoms of COVID-19 were discussed with the patient and how to seek care for testing (follow up with PCP or arrange E-visit).  The importance of social distancing was discussed today.  Time:   Today, I have spent 4 minutes with the patient with telehealth technology discussing the above problems.     Medication Adjustments/Labs and Tests Ordered: Current medicines are reviewed at length with the patient today.  Concerns regarding medicines are outlined above.   Tests Ordered: No orders of the defined types were placed in this encounter.   Medication Changes: No orders of the defined types were placed in this encounter.   Disposition:  Follow up in 6 month(s)  Signed, Quay Burow, MD  06/28/2018 2:32 PM    [facility]

## 2018-06-28 NOTE — Patient Instructions (Signed)
Medication Instructions:  Your physician recommends that you continue on your current medications as directed. Please refer to the Current Medication list given to you today.  If you need a refill on your cardiac medications before your next appointment, please call your pharmacy.   Lab work: NONE If you have labs (blood work) drawn today and your tests are completely normal, you will receive your results only by: Marland Kitchen MyChart Message (if you have MyChart) OR . A paper copy in the mail If you have any lab test that is abnormal or we need to change your treatment, we will call you to review the results.  Testing/Procedures: NONE  Follow-Up: At [facility], you and your health needs are our priority.  As part of our continuing mission to provide you with exceptional heart care, we have created designated Provider Care Teams.  These Care Teams include your primary Cardiologist (physician) and Advanced Practice Providers (APPs -  Physician Assistants and Nurse Practitioners) who all work together to provide you with the care you need, when you need it. . You will need a follow up appointment in 6 months with an APP and in 12 months with Dr. Gwenlyn Found.  Please call our office 2 months in advance to schedule each appointment.  You may see one of the following Advanced Practice Providers on your designated Care Team:   . Kerin Ransom, [place] . Almyra Deforest, PA-C . Fabian Sharp, PA-C . Jory Sims, DNP . Rosaria Ferries, PA-C . Roby Lofts, PA-C . Sande Rives, PA-C

## 2018-06-28 NOTE — Telephone Encounter (Signed)
Patient and/or DPR-approved person aware of AVS instructions and verbalized understanding. Letter including After Visit Summary and any other necessary documents to be mailed to the patient's address on file.  

## 2018-07-13 ENCOUNTER — Ambulatory Visit: Payer: Medicare Other | Admitting: Cardiovascular Disease

## 2019-06-12 DIAGNOSIS — H1013 Acute atopic conjunctivitis, bilateral: Secondary | ICD-10-CM | POA: Diagnosis not present

## 2019-08-22 DIAGNOSIS — R7301 Impaired fasting glucose: Secondary | ICD-10-CM | POA: Diagnosis not present

## 2019-08-22 DIAGNOSIS — E785 Hyperlipidemia, unspecified: Secondary | ICD-10-CM | POA: Diagnosis not present

## 2019-08-22 DIAGNOSIS — M810 Age-related osteoporosis without current pathological fracture: Secondary | ICD-10-CM | POA: Diagnosis not present

## 2019-08-29 DIAGNOSIS — Z0000 Encounter for general adult medical examination without abnormal findings: Secondary | ICD-10-CM | POA: Diagnosis not present

## 2019-08-29 DIAGNOSIS — I10 Essential (primary) hypertension: Secondary | ICD-10-CM | POA: Diagnosis not present

## 2019-08-29 DIAGNOSIS — R7301 Impaired fasting glucose: Secondary | ICD-10-CM | POA: Diagnosis not present

## 2019-08-29 DIAGNOSIS — M810 Age-related osteoporosis without current pathological fracture: Secondary | ICD-10-CM | POA: Diagnosis not present

## 2019-08-29 DIAGNOSIS — E785 Hyperlipidemia, unspecified: Secondary | ICD-10-CM | POA: Diagnosis not present

## 2019-08-29 DIAGNOSIS — L989 Disorder of the skin and subcutaneous tissue, unspecified: Secondary | ICD-10-CM | POA: Diagnosis not present

## 2019-09-13 DIAGNOSIS — Z03818 Encounter for observation for suspected exposure to other biological agents ruled out: Secondary | ICD-10-CM | POA: Diagnosis not present

## 2019-09-15 DIAGNOSIS — J019 Acute sinusitis, unspecified: Secondary | ICD-10-CM | POA: Diagnosis not present

## 2019-09-15 DIAGNOSIS — R197 Diarrhea, unspecified: Secondary | ICD-10-CM | POA: Diagnosis not present

## 2019-10-16 DIAGNOSIS — H9202 Otalgia, left ear: Secondary | ICD-10-CM | POA: Diagnosis not present

## 2019-10-16 DIAGNOSIS — R42 Dizziness and giddiness: Secondary | ICD-10-CM | POA: Diagnosis not present

## 2019-11-24 DIAGNOSIS — D0461 Carcinoma in situ of skin of right upper limb, including shoulder: Secondary | ICD-10-CM | POA: Diagnosis not present

## 2019-11-29 DIAGNOSIS — M25661 Stiffness of right knee, not elsewhere classified: Secondary | ICD-10-CM | POA: Diagnosis not present

## 2019-11-29 DIAGNOSIS — M25561 Pain in right knee: Secondary | ICD-10-CM | POA: Diagnosis not present

## 2019-11-29 DIAGNOSIS — M6281 Muscle weakness (generalized): Secondary | ICD-10-CM | POA: Diagnosis not present

## 2019-12-06 DIAGNOSIS — M6281 Muscle weakness (generalized): Secondary | ICD-10-CM | POA: Diagnosis not present

## 2019-12-06 DIAGNOSIS — M25661 Stiffness of right knee, not elsewhere classified: Secondary | ICD-10-CM | POA: Diagnosis not present

## 2019-12-06 DIAGNOSIS — M25561 Pain in right knee: Secondary | ICD-10-CM | POA: Diagnosis not present

## 2019-12-08 DIAGNOSIS — M6281 Muscle weakness (generalized): Secondary | ICD-10-CM | POA: Diagnosis not present

## 2019-12-08 DIAGNOSIS — M25661 Stiffness of right knee, not elsewhere classified: Secondary | ICD-10-CM | POA: Diagnosis not present

## 2019-12-08 DIAGNOSIS — M25561 Pain in right knee: Secondary | ICD-10-CM | POA: Diagnosis not present

## 2019-12-11 DIAGNOSIS — M6281 Muscle weakness (generalized): Secondary | ICD-10-CM | POA: Diagnosis not present

## 2019-12-11 DIAGNOSIS — M25561 Pain in right knee: Secondary | ICD-10-CM | POA: Diagnosis not present

## 2019-12-11 DIAGNOSIS — M25661 Stiffness of right knee, not elsewhere classified: Secondary | ICD-10-CM | POA: Diagnosis not present

## 2019-12-13 DIAGNOSIS — M25661 Stiffness of right knee, not elsewhere classified: Secondary | ICD-10-CM | POA: Diagnosis not present

## 2019-12-13 DIAGNOSIS — M6281 Muscle weakness (generalized): Secondary | ICD-10-CM | POA: Diagnosis not present

## 2019-12-13 DIAGNOSIS — M25561 Pain in right knee: Secondary | ICD-10-CM | POA: Diagnosis not present

## 2019-12-18 DIAGNOSIS — M25561 Pain in right knee: Secondary | ICD-10-CM | POA: Diagnosis not present

## 2019-12-18 DIAGNOSIS — M6281 Muscle weakness (generalized): Secondary | ICD-10-CM | POA: Diagnosis not present

## 2019-12-18 DIAGNOSIS — M25661 Stiffness of right knee, not elsewhere classified: Secondary | ICD-10-CM | POA: Diagnosis not present

## 2019-12-19 DIAGNOSIS — H18529 Epithelial (juvenile) corneal dystrophy, unspecified eye: Secondary | ICD-10-CM | POA: Diagnosis not present

## 2019-12-19 DIAGNOSIS — H02889 Meibomian gland dysfunction of unspecified eye, unspecified eyelid: Secondary | ICD-10-CM | POA: Diagnosis not present

## 2019-12-19 DIAGNOSIS — H1013 Acute atopic conjunctivitis, bilateral: Secondary | ICD-10-CM | POA: Diagnosis not present

## 2019-12-20 DIAGNOSIS — M25661 Stiffness of right knee, not elsewhere classified: Secondary | ICD-10-CM | POA: Diagnosis not present

## 2019-12-20 DIAGNOSIS — M25561 Pain in right knee: Secondary | ICD-10-CM | POA: Diagnosis not present

## 2019-12-20 DIAGNOSIS — M6281 Muscle weakness (generalized): Secondary | ICD-10-CM | POA: Diagnosis not present

## 2019-12-25 DIAGNOSIS — M25661 Stiffness of right knee, not elsewhere classified: Secondary | ICD-10-CM | POA: Diagnosis not present

## 2019-12-25 DIAGNOSIS — M25561 Pain in right knee: Secondary | ICD-10-CM | POA: Diagnosis not present

## 2019-12-25 DIAGNOSIS — M6281 Muscle weakness (generalized): Secondary | ICD-10-CM | POA: Diagnosis not present

## 2019-12-27 DIAGNOSIS — M25561 Pain in right knee: Secondary | ICD-10-CM | POA: Diagnosis not present

## 2019-12-27 DIAGNOSIS — M6281 Muscle weakness (generalized): Secondary | ICD-10-CM | POA: Diagnosis not present

## 2019-12-27 DIAGNOSIS — M25661 Stiffness of right knee, not elsewhere classified: Secondary | ICD-10-CM | POA: Diagnosis not present

## 2020-01-03 DIAGNOSIS — M25561 Pain in right knee: Secondary | ICD-10-CM | POA: Diagnosis not present

## 2020-01-03 DIAGNOSIS — M25661 Stiffness of right knee, not elsewhere classified: Secondary | ICD-10-CM | POA: Diagnosis not present

## 2020-01-03 DIAGNOSIS — M6281 Muscle weakness (generalized): Secondary | ICD-10-CM | POA: Diagnosis not present

## 2020-01-05 DIAGNOSIS — M25661 Stiffness of right knee, not elsewhere classified: Secondary | ICD-10-CM | POA: Diagnosis not present

## 2020-01-05 DIAGNOSIS — M25561 Pain in right knee: Secondary | ICD-10-CM | POA: Diagnosis not present

## 2020-01-05 DIAGNOSIS — M6281 Muscle weakness (generalized): Secondary | ICD-10-CM | POA: Diagnosis not present

## 2020-01-08 DIAGNOSIS — M6281 Muscle weakness (generalized): Secondary | ICD-10-CM | POA: Diagnosis not present

## 2020-01-08 DIAGNOSIS — M25561 Pain in right knee: Secondary | ICD-10-CM | POA: Diagnosis not present

## 2020-01-08 DIAGNOSIS — M25661 Stiffness of right knee, not elsewhere classified: Secondary | ICD-10-CM | POA: Diagnosis not present

## 2020-01-10 DIAGNOSIS — M25561 Pain in right knee: Secondary | ICD-10-CM | POA: Diagnosis not present

## 2020-01-10 DIAGNOSIS — M25562 Pain in left knee: Secondary | ICD-10-CM | POA: Diagnosis not present

## 2020-01-10 DIAGNOSIS — M1711 Unilateral primary osteoarthritis, right knee: Secondary | ICD-10-CM | POA: Diagnosis not present

## 2020-01-17 DIAGNOSIS — M25561 Pain in right knee: Secondary | ICD-10-CM | POA: Diagnosis not present

## 2020-01-17 DIAGNOSIS — M25661 Stiffness of right knee, not elsewhere classified: Secondary | ICD-10-CM | POA: Diagnosis not present

## 2020-01-17 DIAGNOSIS — M6281 Muscle weakness (generalized): Secondary | ICD-10-CM | POA: Diagnosis not present

## 2020-01-19 ENCOUNTER — Other Ambulatory Visit: Payer: Self-pay

## 2020-01-19 ENCOUNTER — Ambulatory Visit: Payer: Medicare PPO | Admitting: Cardiovascular Disease

## 2020-01-19 ENCOUNTER — Encounter: Payer: Self-pay | Admitting: Cardiovascular Disease

## 2020-01-19 VITALS — BP 174/66 | HR 69 | Ht 62.0 in | Wt 156.4 lb

## 2020-01-19 DIAGNOSIS — R0989 Other specified symptoms and signs involving the circulatory and respiratory systems: Secondary | ICD-10-CM | POA: Diagnosis not present

## 2020-01-19 DIAGNOSIS — M25561 Pain in right knee: Secondary | ICD-10-CM | POA: Diagnosis not present

## 2020-01-19 DIAGNOSIS — E785 Hyperlipidemia, unspecified: Secondary | ICD-10-CM | POA: Diagnosis not present

## 2020-01-19 DIAGNOSIS — M25661 Stiffness of right knee, not elsewhere classified: Secondary | ICD-10-CM | POA: Diagnosis not present

## 2020-01-19 DIAGNOSIS — I10 Essential (primary) hypertension: Secondary | ICD-10-CM

## 2020-01-19 DIAGNOSIS — M6281 Muscle weakness (generalized): Secondary | ICD-10-CM | POA: Diagnosis not present

## 2020-01-19 DIAGNOSIS — I471 Supraventricular tachycardia: Secondary | ICD-10-CM | POA: Diagnosis not present

## 2020-01-19 NOTE — Assessment & Plan Note (Signed)
History of dyslipidemia not on statin therapy with lipid profile performed 08/22/2019 revealing total cholesterol of 265, LDL of 176 and HDL 64.  Given her age and lack of history of CAD I do not feel compelled to begin her on a statin drug at this time.

## 2020-01-19 NOTE — Assessment & Plan Note (Signed)
History of essential hypertension with blood pressure measured today 174/66 although at home she says it is much better than this.  She is on Benicar, metoprolol and diltiazem.

## 2020-01-19 NOTE — Patient Instructions (Signed)
Medication Instructions:  Your physician recommends that you continue on your current medications as directed. Please refer to the Current Medication list given to you today.  *If you need a refill on your cardiac medications before your next appointment, please call your pharmacy*  Testing/Procedures: Your physician has requested that you have a carotid duplex. This test is an ultrasound of the carotid arteries in your neck. It looks at blood flow through these arteries that supply the brain with blood. Allow one hour for this exam. There are no restrictions or special instructions. [place]. [address]  Follow-Up: At [facility], you and your health needs are our priority.  As part of our continuing mission to provide you with exceptional heart care, we have created designated Provider Care Teams.  These Care Teams include your primary Cardiologist (physician) and Advanced Practice Providers (APPs -  Physician Assistants and Nurse Practitioners) who all work together to provide you with the care you need, when you need it.  We recommend signing up for the patient portal called "MyChart".  Sign up information is provided on this After Visit Summary.  MyChart is used to connect with patients for Virtual Visits (Telemedicine).  Patients are able to view lab/test results, encounter notes, upcoming appointments, etc.  Non-urgent messages can be sent to your provider as well.   To learn more about what you can do with MyChart, go to [email or web address].    Your next appointment:   6 month(s)  The format for your next appointment:   In Person  Provider:   Quay Burow, MD

## 2020-01-19 NOTE — Progress Notes (Signed)
01/19/2020 Linda Gates   [birth year]  [id number]  Primary Physician Leighton Ruff, MD Primary Cardiologist: Lorretta Harp MD Garret Reddish, [place], [place]  HPI:  Linda Gates is a 86 y.o.  mildly overweight widowed Caucasian female mother of 1 living child (1 deceased child), grandmother 5 grandchildren referred by Dr. Drema Dallas for ongoing cardiovascular care because of history of PSVT and hypertension.  She was previously a patient of Dr. Thurman Coyer.   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  I last spoke to her on the phone for a virtual telemedicine phone visit 06/28/2018. She does have a history of treated hypertension and untreated hyperlipidemia.  She is had PSVT remotely and palpitations with PACs and PVCs.  She has no other cardiac risk factors other than a mother who died of a myocardial infarction at age 54.  She is never had a heart attack or stroke.   Since I spoke to her on the phone a year and a half ago she has done well.  She did have bilateral knee injections with steroids on 01/10/2020 and 6 hours later developed tachypalpitations lasting an hour which then spontaneously resolved.  She said no recurrence.  She denies chest pain or shortness of breath.   Current Meds  Medication Sig  . Calcium 600-200 MG-UNIT tablet Take 1 tablet by mouth daily.  . cetirizine (ZYRTEC) 10 MG tablet Take 10 mg by mouth daily.  . Cholecalciferol (VITAMIN D) 400 UNITS capsule Take 400 Units by mouth daily.  Marland Kitchen CRANBERRY CONCENTRATE PO Take 1,500 mg by mouth.  . diltiazem (CARDIZEM CD) 240 MG 24 hr capsule TAKE 1 CAPSULE BY MOUTH ONCE EVERY DAY  . Magnesium 250 MG TABS Take 250 mg by mouth.  . metoprolol (LOPRESSOR) 50 MG tablet Take 50 mg by mouth 2 (two) times daily. 1 1/2 tablets in the morning and 1 tablet in the evening  . olmesartan (BENICAR) 20 MG tablet   . Probiotic Product (UP4 PROBIOTICS PO) Take by mouth.  . [DISCONTINUED] losartan  (COZAAR) 50 MG tablet Take 50 mg by mouth daily.  . [DISCONTINUED] trimethoprim (TRIMPEX) 100 MG tablet Take 100 mg by mouth daily.     No Known Allergies  Social History   Socioeconomic History  . Marital status: Widowed    Spouse name: Not on file  . Number of children: Not on file  . Years of education: Not on file  . Highest education level: Not on file  Occupational History  . Not on file  Tobacco Use  . Smoking status: Never Smoker  . Smokeless tobacco: Never Used  Substance and Sexual Activity  . Alcohol use: No  . Drug use: No  . Sexual activity: Not on file  Other Topics Concern  . Not on file  Social History Narrative  . Not on file   Social Determinants of Health   Financial Resource Strain:   . Difficulty of Paying Living Expenses: Not on file  Food Insecurity:   . Worried About Charity fundraiser in the Last Year: Not on file  . Ran Out of Food in the Last Year: Not on file  Transportation Needs:   . Lack of Transportation (Medical): Not on file  . Lack of Transportation (Non-Medical): Not on file  Physical Activity:   . Days of Exercise per Week: Not on file  . Minutes of Exercise per Session: Not on file  Stress:   .  Feeling of Stress : Not on file  Social Connections:   . Frequency of Communication with Friends and Family: Not on file  . Frequency of Social Gatherings with Friends and Family: Not on file  . Attends Religious Services: Not on file  . Active Member of Clubs or Organizations: Not on file  . Attends Archivist Meetings: Not on file  . Marital Status: Not on file  Intimate Partner Violence:   . Fear of Current or Ex-Partner: Not on file  . Emotionally Abused: Not on file  . Physically Abused: Not on file  . Sexually Abused: Not on file     Review of Systems: General: negative for chills, fever, night sweats or weight changes.  Cardiovascular: negative for chest pain, dyspnea on exertion, edema, orthopnea, palpitations,  paroxysmal nocturnal dyspnea or shortness of breath Dermatological: negative for rash Respiratory: negative for cough or wheezing Urologic: negative for hematuria Abdominal: negative for nausea, vomiting, diarrhea, bright red blood per rectum, melena, or hematemesis Neurologic: negative for visual changes, syncope, or dizziness All other systems reviewed and are otherwise negative except as noted above.    Blood pressure (!) 174/66, pulse 69, height 5\' 2"  (1.575 m), weight 156 lb 6.4 oz (70.9 kg), SpO2 98 %.  General appearance: alert and no distress Neck: no adenopathy, no JVD, supple, symmetrical, trachea midline, thyroid not enlarged, symmetric, no tenderness/mass/nodules and Soft left carotid bruit Lungs: clear to auscultation bilaterally Heart: regular rate and rhythm, S1, S2 normal, no murmur, click, rub or gallop Extremities: extremities normal, atraumatic, no cyanosis or edema Pulses: 2+ and symmetric Skin: Skin color, texture, turgor normal. No rashes or lesions Neurologic: Alert and oriented X 3, normal strength and tone. Normal symmetric reflexes. Normal coordination and gait  EKG sinus rhythm at 69 without ST or T wave changes.  Personally reviewed this EKG.  ASSESSMENT AND PLAN:   Benign essential HTN History of essential hypertension with blood pressure measured today 174/66 although at home she says it is much better than this.  She is on Benicar, metoprolol and diltiazem.  Paroxysmal supraventricular tachycardia ([place]) History of PSVT in the past.  She had had no issues with tachypalpitations until 01/10/2019 when she when she had steroid injections in both knees by her orthopedic surgeon.  6 hours later she developed tachypalpitations lasting approximately an hour.  She has not had any since.  She is in sinus rhythm today.  Dyslipidemia History of dyslipidemia not on statin therapy with lipid profile performed 08/22/2019 revealing total cholesterol of 265, LDL of 176 and  HDL 64.  Given her age and lack of history of CAD I do not feel compelled to begin her on a statin drug at this time.      Lorretta Harp MD FACP,FACC,FAHA, [facility] 01/19/2020 4:36 PM

## 2020-01-19 NOTE — Assessment & Plan Note (Signed)
History of PSVT in the past.  She had had no issues with tachypalpitations until 01/10/2019 when she when she had steroid injections in both knees by her orthopedic surgeon.  6 hours later she developed tachypalpitations lasting approximately an hour.  She has not had any since.  She is in sinus rhythm today.

## 2020-01-22 DIAGNOSIS — M25661 Stiffness of right knee, not elsewhere classified: Secondary | ICD-10-CM | POA: Diagnosis not present

## 2020-01-22 DIAGNOSIS — M6281 Muscle weakness (generalized): Secondary | ICD-10-CM | POA: Diagnosis not present

## 2020-01-22 DIAGNOSIS — M25561 Pain in right knee: Secondary | ICD-10-CM | POA: Diagnosis not present

## 2020-01-25 DIAGNOSIS — R1319 Other dysphagia: Secondary | ICD-10-CM | POA: Diagnosis not present

## 2020-01-29 ENCOUNTER — Other Ambulatory Visit: Payer: Self-pay

## 2020-01-29 ENCOUNTER — Ambulatory Visit (HOSPITAL_COMMUNITY)
Admission: RE | Admit: 2020-01-29 | Discharge: 2020-01-29 | Disposition: A | Discharge status: Home / Self Care | Payer: Medicare PPO | Source: Ambulatory Visit | Attending: Cardiology | Admitting: Cardiology

## 2020-01-29 DIAGNOSIS — R0989 Other specified symptoms and signs involving the circulatory and respiratory systems: Secondary | ICD-10-CM | POA: Diagnosis not present

## 2020-01-30 ENCOUNTER — Other Ambulatory Visit: Payer: Self-pay | Admitting: Physician Assistant

## 2020-01-30 DIAGNOSIS — R1310 Dysphagia, unspecified: Secondary | ICD-10-CM

## 2020-01-30 DIAGNOSIS — R198 Other specified symptoms and signs involving the digestive system and abdomen: Secondary | ICD-10-CM | POA: Diagnosis not present

## 2020-01-30 DIAGNOSIS — R1013 Epigastric pain: Secondary | ICD-10-CM | POA: Diagnosis not present

## 2020-02-06 ENCOUNTER — Ambulatory Visit
Admission: RE | Admit: 2020-02-06 | Discharge: 2020-02-06 | Disposition: A | Discharge status: Home / Self Care | Payer: Medicare PPO | Source: Ambulatory Visit | Attending: Physician Assistant | Admitting: Physician Assistant

## 2020-02-06 DIAGNOSIS — R1310 Dysphagia, unspecified: Secondary | ICD-10-CM

## 2020-02-06 DIAGNOSIS — K219 Gastro-esophageal reflux disease without esophagitis: Secondary | ICD-10-CM | POA: Diagnosis not present

## 2020-02-28 DIAGNOSIS — R0789 Other chest pain: Secondary | ICD-10-CM | POA: Diagnosis not present

## 2020-02-28 DIAGNOSIS — R059 Cough, unspecified: Secondary | ICD-10-CM | POA: Diagnosis not present

## 2020-02-28 DIAGNOSIS — Z20822 Contact with and (suspected) exposure to covid-19: Secondary | ICD-10-CM | POA: Diagnosis not present

## 2020-04-02 DIAGNOSIS — D0461 Carcinoma in situ of skin of right upper limb, including shoulder: Secondary | ICD-10-CM | POA: Diagnosis not present

## 2020-04-02 DIAGNOSIS — D225 Melanocytic nevi of trunk: Secondary | ICD-10-CM | POA: Diagnosis not present

## 2020-04-02 DIAGNOSIS — L814 Other melanin hyperpigmentation: Secondary | ICD-10-CM | POA: Diagnosis not present

## 2020-04-02 DIAGNOSIS — L570 Actinic keratosis: Secondary | ICD-10-CM | POA: Diagnosis not present

## 2020-04-02 DIAGNOSIS — L821 Other seborrheic keratosis: Secondary | ICD-10-CM | POA: Diagnosis not present

## 2020-05-21 DIAGNOSIS — K2100 Gastro-esophageal reflux disease with esophagitis, without bleeding: Secondary | ICD-10-CM | POA: Diagnosis not present

## 2020-05-21 DIAGNOSIS — K5901 Slow transit constipation: Secondary | ICD-10-CM | POA: Diagnosis not present

## 2020-05-21 DIAGNOSIS — R1310 Dysphagia, unspecified: Secondary | ICD-10-CM | POA: Diagnosis not present

## 2020-05-29 DIAGNOSIS — M1711 Unilateral primary osteoarthritis, right knee: Secondary | ICD-10-CM | POA: Diagnosis not present

## 2020-06-19 DIAGNOSIS — M1711 Unilateral primary osteoarthritis, right knee: Secondary | ICD-10-CM | POA: Diagnosis not present

## 2020-06-26 DIAGNOSIS — M1711 Unilateral primary osteoarthritis, right knee: Secondary | ICD-10-CM | POA: Diagnosis not present

## 2020-07-03 DIAGNOSIS — M1711 Unilateral primary osteoarthritis, right knee: Secondary | ICD-10-CM | POA: Diagnosis not present

## 2020-07-24 ENCOUNTER — Encounter: Payer: Self-pay | Admitting: Cardiovascular Disease

## 2020-07-24 ENCOUNTER — Other Ambulatory Visit: Payer: Self-pay

## 2020-07-24 ENCOUNTER — Ambulatory Visit (INDEPENDENT_AMBULATORY_CARE_PROVIDER_SITE_OTHER): Payer: Medicare PPO | Admitting: Cardiovascular Disease

## 2020-07-24 VITALS — BP 132/70 | HR 60 | Ht 62.0 in | Wt 161.0 lb

## 2020-07-24 DIAGNOSIS — I10 Essential (primary) hypertension: Secondary | ICD-10-CM | POA: Diagnosis not present

## 2020-07-24 DIAGNOSIS — I471 Supraventricular tachycardia, unspecified: Secondary | ICD-10-CM

## 2020-07-24 DIAGNOSIS — E785 Hyperlipidemia, unspecified: Secondary | ICD-10-CM | POA: Diagnosis not present

## 2020-07-24 MED ORDER — DILTIAZEM HCL ER COATED BEADS 180 MG PO CP24: 180.0000 mg | ORAL_CAPSULE | Freq: Every day | ORAL | 3 refills | Status: AC

## 2020-07-24 NOTE — Assessment & Plan Note (Signed)
History of essential hypertension a blood pressure measured today at 132/70.  She is on diltiazem, metoprolol and Benicar.  Her home blood pressure readings have been significantly lower than this and she feels somewhat fatigued.  I am going to cut back her diltiazem from 240 mg a day to 180 mg a day.

## 2020-07-24 NOTE — Patient Instructions (Signed)
Medication Instructions:  DECREASE diltiazem to 180mg  daily (this is a new prescription)  *If you need a refill on your cardiac medications before your next appointment, please call your pharmacy*  Follow-Up: At [facility], you and your health needs are our priority.  As part of our continuing mission to provide you with exceptional heart care, we have created designated Provider Care Teams.  These Care Teams include your primary Cardiologist (physician) and Advanced Practice Providers (APPs -  Physician Assistants and Nurse Practitioners) who all work together to provide you with the care you need, when you need it.  We recommend signing up for the patient portal called "MyChart".  Sign up information is provided on this After Visit Summary.  MyChart is used to connect with patients for Virtual Visits (Telemedicine).  Patients are able to view lab/test results, encounter notes, upcoming appointments, etc.  Non-urgent messages can be sent to your provider as well.   To learn more about what you can do with MyChart, go to [email or web address].    Your next appointment:   12 month(s)  The format for your next appointment:   In Person  Provider:   Dr. Gwenlyn Found

## 2020-07-24 NOTE — Progress Notes (Signed)
07/24/2020 Linda Gates   [birth year]  [id number]  Primary Physician Vernie Shanks, MD Primary Cardiologist: Lorretta Harp MD Garret Reddish, [place], [place]  HPI:  Linda Gates is a 87 y.o.   mildly overweight widowed Caucasian female mother of 1 living child (1 deceased child), grandmother 5 grandchildren referred by Dr. Drema Dallas for ongoing cardiovascular care because of history of PSVT and hypertension. She was previously a patient of Dr. Thurman Coyer. 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  I last saw her in the office 01/19/2020. She does have a history of treated hypertension and untreated hyperlipidemia. She is had PSVT remotely and palpitations with PACs and PVCs. She has no other cardiac risk factors other than a mother who died of a myocardial infarction at age 42. She is never had a heart attack or stroke.   Since I saw her 6 months ago she continues to do well.  She did have some tachypalpitations when she had steroids injected into her knees but none over the last 6 months.  She complains of some fatigue and did show me some home blood pressure readings that was somewhat low.  She denies chest pain or shortness of breath.   Current Meds  Medication Sig  . Calcium 600-200 MG-UNIT tablet Take 1 tablet by mouth daily.  . Cholecalciferol (VITAMIN D) 400 UNITS capsule Take 400 Units by mouth daily.  Marland Kitchen CRANBERRY CONCENTRATE PO Take 1,500 mg by mouth.  . diltiazem (CARDIZEM CD) 180 MG 24 hr capsule Take 1 capsule (180 mg total) by mouth daily.  . Magnesium 250 MG TABS Take 250 mg by mouth.  . metoprolol (LOPRESSOR) 50 MG tablet Take 50 mg by mouth 2 (two) times daily. 1 1/2 tablets in the morning and 1 tablet in the evening  . olmesartan (BENICAR) 20 MG tablet   . pantoprazole (PROTONIX) 40 MG tablet Take 40 mg by mouth daily.  . Probiotic Product (UP4 PROBIOTICS PO) Take by mouth.  . [DISCONTINUED] diltiazem (CARDIZEM CD) 240 MG  24 hr capsule TAKE 1 CAPSULE BY MOUTH ONCE EVERY DAY     No Known Allergies  Social History   Socioeconomic History  . Marital status: Widowed    Spouse name: Not on file  . Number of children: Not on file  . Years of education: Not on file  . Highest education level: Not on file  Occupational History  . Not on file  Tobacco Use  . Smoking status: Never Smoker  . Smokeless tobacco: Never Used  Substance and Sexual Activity  . Alcohol use: No  . Drug use: No  . Sexual activity: Not on file  Other Topics Concern  . Not on file  Social History Narrative  . Not on file   Social Determinants of Health   Financial Resource Strain: Not on file  Food Insecurity: Not on file  Transportation Needs: Not on file  Physical Activity: Not on file  Stress: Not on file  Social Connections: Not on file  Intimate Partner Violence: Not on file     Review of Systems: General: negative for chills, fever, night sweats or weight changes.  Cardiovascular: negative for chest pain, dyspnea on exertion, edema, orthopnea, palpitations, paroxysmal nocturnal dyspnea or shortness of breath Dermatological: negative for rash Respiratory: negative for cough or wheezing Urologic: negative for hematuria Abdominal: negative for nausea, vomiting, diarrhea, bright red blood per rectum, melena, or hematemesis Neurologic: negative for  visual changes, syncope, or dizziness All other systems reviewed and are otherwise negative except as noted above.    Blood pressure 132/70, pulse 60, height 5\' 2"  (1.575 m), weight 161 lb (73 kg).  General appearance: alert and no distress Neck: no adenopathy, no carotid bruit, no JVD, supple, symmetrical, trachea midline and thyroid not enlarged, symmetric, no tenderness/mass/nodules Lungs: clear to auscultation bilaterally Heart: regular rate and rhythm, S1, S2 normal, no murmur, click, rub or gallop Extremities: extremities normal, atraumatic, no cyanosis or  edema Pulses: 2+ and symmetric Skin: Skin color, texture, turgor normal. No rashes or lesions Neurologic: Alert and oriented X 3, normal strength and tone. Normal symmetric reflexes. Normal coordination and gait  EKG sinus rhythm at 68 with borderline voltage criteria for LVH.  I personally reviewed this EKG.  ASSESSMENT AND PLAN:   Benign essential HTN History of essential hypertension a blood pressure measured today at 132/70.  She is on diltiazem, metoprolol and Benicar.  Her home blood pressure readings have been significantly lower than this and she feels somewhat fatigued.  I am going to cut back her diltiazem from 240 mg a day to 180 mg a day.  Paroxysmal supraventricular tachycardia ([facility]) History of PSVT in the past most likely related to steroid injections in her knees.  She has had no further episodes since I last saw her.  Dyslipidemia History of dyslipidemia not on statin therapy with lipid profile performed 08/22/2019 revealing total cholesterol 265, LDL of 176 and HDL of 64.  Given her age and lack of CAD as I mentioned last year I do not feel compelled to begin her on a statin drug.      Lorretta Harp MD FACP,FACC,FAHA, [facility] 07/24/2020 11:04 AM

## 2020-07-24 NOTE — Assessment & Plan Note (Signed)
History of dyslipidemia not on statin therapy with lipid profile performed 08/22/2019 revealing total cholesterol 265, LDL of 176 and HDL of 64.  Given her age and lack of CAD as I mentioned last year I do not feel compelled to begin her on a statin drug.

## 2020-07-24 NOTE — Assessment & Plan Note (Signed)
History of PSVT in the past most likely related to steroid injections in her knees.  She has had no further episodes since I last saw her.

## 2020-09-16 DIAGNOSIS — J309 Allergic rhinitis, unspecified: Secondary | ICD-10-CM | POA: Diagnosis not present

## 2020-09-16 DIAGNOSIS — R42 Dizziness and giddiness: Secondary | ICD-10-CM | POA: Diagnosis not present

## 2020-09-27 DIAGNOSIS — I471 Supraventricular tachycardia: Secondary | ICD-10-CM | POA: Diagnosis not present

## 2020-09-27 DIAGNOSIS — Z1389 Encounter for screening for other disorder: Secondary | ICD-10-CM | POA: Diagnosis not present

## 2020-09-27 DIAGNOSIS — R0981 Nasal congestion: Secondary | ICD-10-CM | POA: Diagnosis not present

## 2020-09-27 DIAGNOSIS — R739 Hyperglycemia, unspecified: Secondary | ICD-10-CM | POA: Diagnosis not present

## 2020-09-27 DIAGNOSIS — Z0000 Encounter for general adult medical examination without abnormal findings: Secondary | ICD-10-CM | POA: Diagnosis not present

## 2020-09-27 DIAGNOSIS — I10 Essential (primary) hypertension: Secondary | ICD-10-CM | POA: Diagnosis not present

## 2020-11-01 DIAGNOSIS — R42 Dizziness and giddiness: Secondary | ICD-10-CM | POA: Diagnosis not present

## 2020-11-01 DIAGNOSIS — R739 Hyperglycemia, unspecified: Secondary | ICD-10-CM | POA: Diagnosis not present

## 2020-11-01 DIAGNOSIS — M25561 Pain in right knee: Secondary | ICD-10-CM | POA: Diagnosis not present

## 2020-11-01 DIAGNOSIS — I471 Supraventricular tachycardia: Secondary | ICD-10-CM | POA: Diagnosis not present

## 2020-11-01 DIAGNOSIS — I10 Essential (primary) hypertension: Secondary | ICD-10-CM | POA: Diagnosis not present

## 2020-11-01 DIAGNOSIS — R0981 Nasal congestion: Secondary | ICD-10-CM | POA: Diagnosis not present

## 2020-11-19 DIAGNOSIS — Z85828 Personal history of other malignant neoplasm of skin: Secondary | ICD-10-CM | POA: Diagnosis not present

## 2020-11-19 DIAGNOSIS — D225 Melanocytic nevi of trunk: Secondary | ICD-10-CM | POA: Diagnosis not present

## 2020-11-19 DIAGNOSIS — L718 Other rosacea: Secondary | ICD-10-CM | POA: Diagnosis not present

## 2020-11-19 DIAGNOSIS — L570 Actinic keratosis: Secondary | ICD-10-CM | POA: Diagnosis not present

## 2020-11-19 DIAGNOSIS — L821 Other seborrheic keratosis: Secondary | ICD-10-CM | POA: Diagnosis not present

## 2020-11-19 DIAGNOSIS — D692 Other nonthrombocytopenic purpura: Secondary | ICD-10-CM | POA: Diagnosis not present

## 2020-11-29 DIAGNOSIS — Z23 Encounter for immunization: Secondary | ICD-10-CM | POA: Diagnosis not present

## 2020-12-11 DIAGNOSIS — U071 COVID-19: Secondary | ICD-10-CM | POA: Diagnosis not present

## 2021-01-21 DIAGNOSIS — H18029 Argentous corneal deposits, unspecified eye: Secondary | ICD-10-CM | POA: Diagnosis not present

## 2021-01-21 DIAGNOSIS — H10223 Pseudomembranous conjunctivitis, bilateral: Secondary | ICD-10-CM | POA: Diagnosis not present

## 2021-01-21 DIAGNOSIS — Z961 Presence of intraocular lens: Secondary | ICD-10-CM | POA: Diagnosis not present

## 2021-01-30 DIAGNOSIS — I10 Essential (primary) hypertension: Secondary | ICD-10-CM | POA: Diagnosis not present

## 2021-01-30 DIAGNOSIS — K449 Diaphragmatic hernia without obstruction or gangrene: Secondary | ICD-10-CM | POA: Diagnosis not present

## 2021-01-30 DIAGNOSIS — R739 Hyperglycemia, unspecified: Secondary | ICD-10-CM | POA: Diagnosis not present

## 2021-01-30 DIAGNOSIS — E785 Hyperlipidemia, unspecified: Secondary | ICD-10-CM | POA: Diagnosis not present

## 2021-01-30 DIAGNOSIS — I471 Supraventricular tachycardia: Secondary | ICD-10-CM | POA: Diagnosis not present

## 2021-03-31 DIAGNOSIS — R197 Diarrhea, unspecified: Secondary | ICD-10-CM | POA: Diagnosis not present

## 2021-03-31 DIAGNOSIS — R112 Nausea with vomiting, unspecified: Secondary | ICD-10-CM | POA: Diagnosis not present

## 2021-03-31 DIAGNOSIS — K219 Gastro-esophageal reflux disease without esophagitis: Secondary | ICD-10-CM | POA: Diagnosis not present

## 2021-04-28 DIAGNOSIS — K219 Gastro-esophageal reflux disease without esophagitis: Secondary | ICD-10-CM | POA: Diagnosis not present

## 2021-04-28 DIAGNOSIS — R197 Diarrhea, unspecified: Secondary | ICD-10-CM | POA: Diagnosis not present

## 2021-05-26 DIAGNOSIS — R197 Diarrhea, unspecified: Secondary | ICD-10-CM | POA: Diagnosis not present

## 2021-05-26 DIAGNOSIS — K219 Gastro-esophageal reflux disease without esophagitis: Secondary | ICD-10-CM | POA: Diagnosis not present

## 2021-05-26 DIAGNOSIS — I10 Essential (primary) hypertension: Secondary | ICD-10-CM | POA: Diagnosis not present

## 2021-06-09 DIAGNOSIS — R0981 Nasal congestion: Secondary | ICD-10-CM | POA: Diagnosis not present

## 2021-06-09 DIAGNOSIS — J029 Acute pharyngitis, unspecified: Secondary | ICD-10-CM | POA: Diagnosis not present

## 2021-06-09 DIAGNOSIS — Z20822 Contact with and (suspected) exposure to covid-19: Secondary | ICD-10-CM | POA: Diagnosis not present

## 2021-06-18 DIAGNOSIS — J029 Acute pharyngitis, unspecified: Secondary | ICD-10-CM | POA: Diagnosis not present

## 2021-06-18 DIAGNOSIS — R0981 Nasal congestion: Secondary | ICD-10-CM | POA: Diagnosis not present

## 2021-07-16 DIAGNOSIS — N390 Urinary tract infection, site not specified: Secondary | ICD-10-CM | POA: Diagnosis not present

## 2021-09-30 ENCOUNTER — Ambulatory Visit: Payer: Medicare PPO | Admitting: Cardiovascular Disease

## 2021-10-21 DIAGNOSIS — R21 Rash and other nonspecific skin eruption: Secondary | ICD-10-CM | POA: Diagnosis not present

## 2021-11-06 DIAGNOSIS — L239 Allergic contact dermatitis, unspecified cause: Secondary | ICD-10-CM | POA: Diagnosis not present

## 2021-11-21 ENCOUNTER — Encounter: Payer: Self-pay | Admitting: Cardiovascular Disease

## 2021-11-21 ENCOUNTER — Ambulatory Visit: Payer: Medicare PPO | Attending: Cardiovascular Disease | Admitting: Cardiovascular Disease

## 2021-11-21 DIAGNOSIS — I10 Essential (primary) hypertension: Secondary | ICD-10-CM | POA: Diagnosis not present

## 2021-11-21 DIAGNOSIS — I471 Supraventricular tachycardia, unspecified: Secondary | ICD-10-CM | POA: Diagnosis not present

## 2021-11-21 DIAGNOSIS — E785 Hyperlipidemia, unspecified: Secondary | ICD-10-CM

## 2021-11-21 NOTE — Patient Instructions (Signed)
Medication Instructions:  Your physician recommends that you continue on your current medications as directed. Please refer to the Current Medication list given to you today.  *If you need a refill on your cardiac medications before your next appointment, please call your pharmacy*   Follow-Up: At [facility], you and your health needs are our priority.  As part of our continuing mission to provide you with exceptional heart care, we have created designated Provider Care Teams.  These Care Teams include your primary Cardiologist (physician) and Advanced Practice Providers (APPs -  Physician Assistants and Nurse Practitioners) who all work together to provide you with the care you need, when you need it.  We recommend signing up for the patient portal called "MyChart".  Sign up information is provided on this After Visit Summary.  MyChart is used to connect with patients for Virtual Visits (Telemedicine).  Patients are able to view lab/test results, encounter notes, upcoming appointments, etc.  Non-urgent messages can be sent to your provider as well.   To learn more about what you can do with MyChart, go to [email or web address].    Your next appointment:   12 month(s)  The format for your next appointment:   In Person  Provider:   Jonathan Berry, MD   

## 2021-11-21 NOTE — Progress Notes (Signed)
11/21/2021 Linda Gates   07-21-1929  [id number]  Primary Physician Vernie Shanks, MD (Inactive) Primary Cardiologist: Lorretta Harp MD Garret Reddish, [place], [place]  HPI:  Linda Gates is a 88 y.o.  mildly overweight widowed Caucasian female mother of 1 living child (1 deceased child), grandmother 5 grandchildren referred by Dr. Drema Dallas for ongoing cardiovascular care because of history of PSVT and hypertension.  She was previously a patient of Dr. Thurman Coyer.   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  I last saw her in the office 07/24/2020.  She does have a history of treated hypertension and untreated hyperlipidemia.  She is had PSVT remotely and palpitations with PACs and PVCs.  She has no other cardiac risk factors other than a mother who died of a myocardial infarction at age 86.  She is never had a heart attack or stroke.    Since I saw her a year and a half ago she continues to do well.  She is somewhat limited by pain in her knee making ambulation somewhat difficult.  She denies chest pain or shortness of breath.  She has not had any palpitations since I saw her a year and a half ago.  Current Meds  Medication Sig   Calcium 600-200 MG-UNIT tablet Take 1 tablet by mouth daily.   Cholecalciferol (VITAMIN D) 400 UNITS capsule Take 1,000 Units by mouth daily.   diltiazem (CARDIZEM CD) 180 MG 24 hr capsule Take 1 capsule (180 mg total) by mouth daily.   Magnesium 250 MG TABS Take 250 mg by mouth.   metoprolol (LOPRESSOR) 50 MG tablet Take 50 mg by mouth 2 (two) times daily. 1 1/2 tablets in the morning and 1 tablet in the evening   Probiotic Product (UP4 PROBIOTICS PO) Take by mouth.   [DISCONTINUED] CRANBERRY CONCENTRATE PO Take 1,500 mg by mouth.   [DISCONTINUED] olmesartan (BENICAR) 20 MG tablet    [DISCONTINUED] pantoprazole (PROTONIX) 40 MG tablet Take 40 mg by mouth daily.     No Known Allergies  Social History    Socioeconomic History   Marital status: Widowed    Spouse name: Not on file   Number of children: Not on file   Years of education: Not on file   Highest education level: Not on file  Occupational History   Not on file  Tobacco Use   Smoking status: Never   Smokeless tobacco: Never  Substance and Sexual Activity   Alcohol use: No   Drug use: No   Sexual activity: Not on file  Other Topics Concern   Not on file  Social History Narrative   Not on file   Social Determinants of Health   Financial Resource Strain: Not on file  Food Insecurity: Not on file  Transportation Needs: Not on file  Physical Activity: Not on file  Stress: Not on file  Social Connections: Not on file  Intimate Partner Violence: Not on file     Review of Systems: General: negative for chills, fever, night sweats or weight changes.  Cardiovascular: negative for chest pain, dyspnea on exertion, edema, orthopnea, palpitations, paroxysmal nocturnal dyspnea or shortness of breath Dermatological: negative for rash Respiratory: negative for cough or wheezing Urologic: negative for hematuria Abdominal: negative for nausea, vomiting, diarrhea, bright red blood per rectum, melena, or hematemesis Neurologic: negative for visual changes, syncope, or dizziness All other systems reviewed and are otherwise negative except as noted above.  Blood pressure 136/70, pulse 60, height '5\' 3"'$  (1.6 m), weight 151 lb 9.6 oz (68.8 kg), SpO2 97 %.  General appearance: alert and no distress Neck: no adenopathy, no carotid bruit, no JVD, supple, symmetrical, trachea midline, and thyroid not enlarged, symmetric, no tenderness/mass/nodules Lungs: clear to auscultation bilaterally Heart: regular rate and rhythm, S1, S2 normal, no murmur, click, rub or gallop Extremities: extremities normal, atraumatic, no cyanosis or edema Pulses: 2+ and symmetric Skin: Skin color, texture, turgor normal. No rashes or lesions Neurologic:  Grossly normal  EKG sinus bradycardia 57 with first-degree AV block.  I personally reviewed this EKG.  ASSESSMENT AND PLAN:   Benign essential HTN History of essential hypertension blood pressure measured today at 136/70.  She is on diltiazem and metoprolol.  Paroxysmal supraventricular tachycardia (HCC) History of PSVT in the past but no palpitations since I saw her a year and a half ago.  The last time she had palpitations was when she had steroid injections in her knee.  Dyslipidemia History of dyslipidemia not on statin therapy with lipid profile performed 10/10/2021 revealing total cholesterol 269, LDL 175 and HDL 60.  Given her age and lack of symptoms I do not feel compelled to begin her on statin therapy at the current time.     Lorretta Harp MD FACP,FACC,FAHA, [facility] 11/21/2021 10:04 AM

## 2021-11-21 NOTE — Assessment & Plan Note (Signed)
History of dyslipidemia not on statin therapy with lipid profile performed 10/10/2021 revealing total cholesterol 269, LDL 175 and HDL 60.  Given her age and lack of symptoms I do not feel compelled to begin her on statin therapy at the current time.

## 2021-11-21 NOTE — Assessment & Plan Note (Signed)
History of PSVT in the past but no palpitations since I saw her a year and a half ago.  The last time she had palpitations was when she had steroid injections in her knee.

## 2021-11-21 NOTE — Assessment & Plan Note (Signed)
History of essential hypertension blood pressure measured today at 136/70.  She is on diltiazem and metoprolol.

## 2021-12-03 DIAGNOSIS — R519 Headache, unspecified: Secondary | ICD-10-CM | POA: Diagnosis not present

## 2021-12-03 DIAGNOSIS — R197 Diarrhea, unspecified: Secondary | ICD-10-CM | POA: Diagnosis not present

## 2021-12-03 DIAGNOSIS — R300 Dysuria: Secondary | ICD-10-CM | POA: Diagnosis not present

## 2021-12-03 DIAGNOSIS — K219 Gastro-esophageal reflux disease without esophagitis: Secondary | ICD-10-CM | POA: Diagnosis not present

## 2021-12-04 ENCOUNTER — Other Ambulatory Visit: Payer: Self-pay | Admitting: Family Medicine

## 2021-12-04 DIAGNOSIS — R519 Headache, unspecified: Secondary | ICD-10-CM

## 2021-12-10 DIAGNOSIS — K219 Gastro-esophageal reflux disease without esophagitis: Secondary | ICD-10-CM | POA: Diagnosis not present

## 2021-12-10 DIAGNOSIS — R197 Diarrhea, unspecified: Secondary | ICD-10-CM | POA: Diagnosis not present

## 2021-12-10 DIAGNOSIS — R1310 Dysphagia, unspecified: Secondary | ICD-10-CM | POA: Diagnosis not present

## 2021-12-11 DIAGNOSIS — D225 Melanocytic nevi of trunk: Secondary | ICD-10-CM | POA: Diagnosis not present

## 2021-12-11 DIAGNOSIS — L570 Actinic keratosis: Secondary | ICD-10-CM | POA: Diagnosis not present

## 2021-12-11 DIAGNOSIS — D2271 Melanocytic nevi of right lower limb, including hip: Secondary | ICD-10-CM | POA: Diagnosis not present

## 2021-12-11 DIAGNOSIS — L821 Other seborrheic keratosis: Secondary | ICD-10-CM | POA: Diagnosis not present

## 2021-12-31 DIAGNOSIS — R197 Diarrhea, unspecified: Secondary | ICD-10-CM | POA: Diagnosis not present

## 2021-12-31 DIAGNOSIS — R1310 Dysphagia, unspecified: Secondary | ICD-10-CM | POA: Diagnosis not present

## 2022-01-26 DIAGNOSIS — H18529 Epithelial (juvenile) corneal dystrophy, unspecified eye: Secondary | ICD-10-CM | POA: Diagnosis not present

## 2022-01-26 DIAGNOSIS — H16231 Neurotrophic keratoconjunctivitis, right eye: Secondary | ICD-10-CM | POA: Diagnosis not present

## 2022-02-26 DIAGNOSIS — R1310 Dysphagia, unspecified: Secondary | ICD-10-CM | POA: Diagnosis not present

## 2022-02-26 DIAGNOSIS — K222 Esophageal obstruction: Secondary | ICD-10-CM | POA: Diagnosis not present

## 2022-04-01 DIAGNOSIS — K222 Esophageal obstruction: Secondary | ICD-10-CM | POA: Diagnosis not present

## 2022-04-15 DIAGNOSIS — R4589 Other symptoms and signs involving emotional state: Secondary | ICD-10-CM | POA: Diagnosis not present

## 2022-04-15 DIAGNOSIS — R0981 Nasal congestion: Secondary | ICD-10-CM | POA: Diagnosis not present

## 2022-04-15 DIAGNOSIS — I10 Essential (primary) hypertension: Secondary | ICD-10-CM | POA: Diagnosis not present

## 2022-07-29 DIAGNOSIS — M1711 Unilateral primary osteoarthritis, right knee: Secondary | ICD-10-CM | POA: Diagnosis not present

## 2022-07-29 DIAGNOSIS — M170 Bilateral primary osteoarthritis of knee: Secondary | ICD-10-CM | POA: Diagnosis not present

## 2022-08-26 DIAGNOSIS — M1711 Unilateral primary osteoarthritis, right knee: Secondary | ICD-10-CM | POA: Diagnosis not present

## 2022-09-03 DIAGNOSIS — M158 Other polyosteoarthritis: Secondary | ICD-10-CM | POA: Diagnosis not present

## 2022-09-03 DIAGNOSIS — R2681 Unsteadiness on feet: Secondary | ICD-10-CM | POA: Diagnosis not present

## 2022-09-03 DIAGNOSIS — R2689 Other abnormalities of gait and mobility: Secondary | ICD-10-CM | POA: Diagnosis not present

## 2022-09-04 DIAGNOSIS — R2681 Unsteadiness on feet: Secondary | ICD-10-CM | POA: Diagnosis not present

## 2022-09-04 DIAGNOSIS — R2689 Other abnormalities of gait and mobility: Secondary | ICD-10-CM | POA: Diagnosis not present

## 2022-09-04 DIAGNOSIS — M158 Other polyosteoarthritis: Secondary | ICD-10-CM | POA: Diagnosis not present

## 2022-09-09 DIAGNOSIS — M158 Other polyosteoarthritis: Secondary | ICD-10-CM | POA: Diagnosis not present

## 2022-09-09 DIAGNOSIS — R2689 Other abnormalities of gait and mobility: Secondary | ICD-10-CM | POA: Diagnosis not present

## 2022-09-09 DIAGNOSIS — R2681 Unsteadiness on feet: Secondary | ICD-10-CM | POA: Diagnosis not present

## 2022-09-12 IMAGING — RF DG ESOPHAGUS
9 of 10 series · 14 of 24 positions shown · non-contrast
Comparison: None.

CLINICAL DATA: 89-year-old with dysphagia.

EXAM:
ESOPHOGRAM / BARIUM SWALLOW / BARIUM TABLET STUDY
TECHNIQUE: Combined double contrast and single contrast examination performed
using effervescent crystals, thick barium liquid, and thin barium
liquid. The patient was observed with fluoroscopy swallowing a 13 mm
barium sulphate tablet.
FLUOROSCOPY TIME:  Fluoroscopy Time: 1 minutes and 42 seconds of
low-dose pulsed fluoroscopy.
Radiation Exposure Index (if provided by the fluoroscopic device):
60.0 mGy
Number of Acquired Spot Images: 0

[Series 1: sequence · 2 of 28 frames shown (1 of 8)]
[frame 5/28]
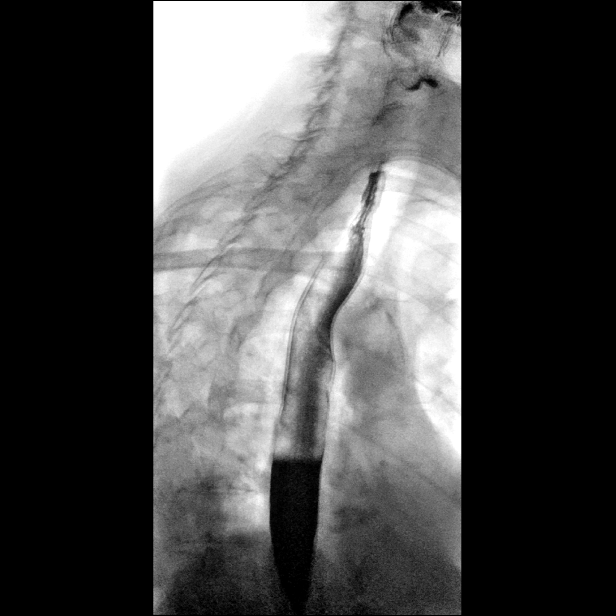
[frame 24/28]
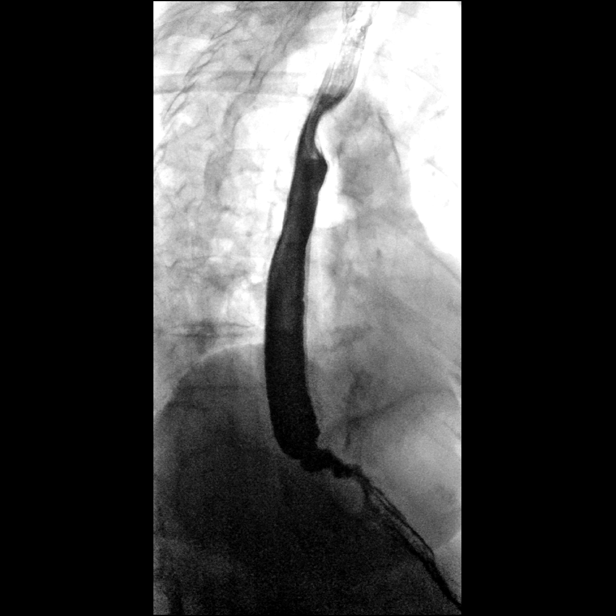

[Series 3: sequence · 2 of 18 frames shown (2 of 8)]
[frame 3/18]
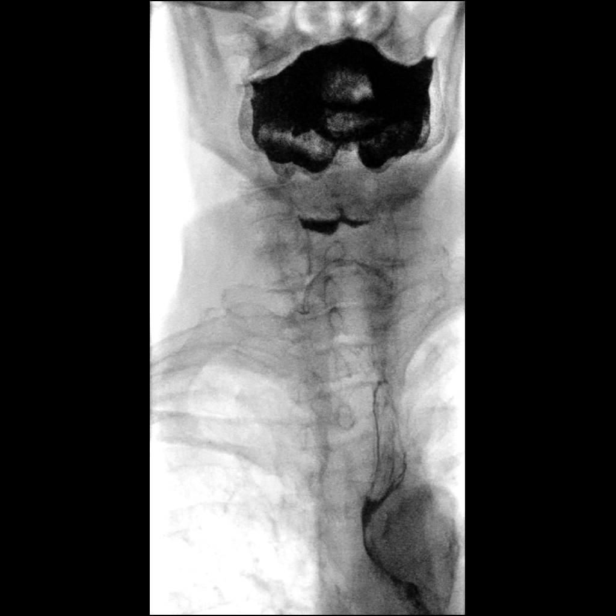
[frame 16/18]
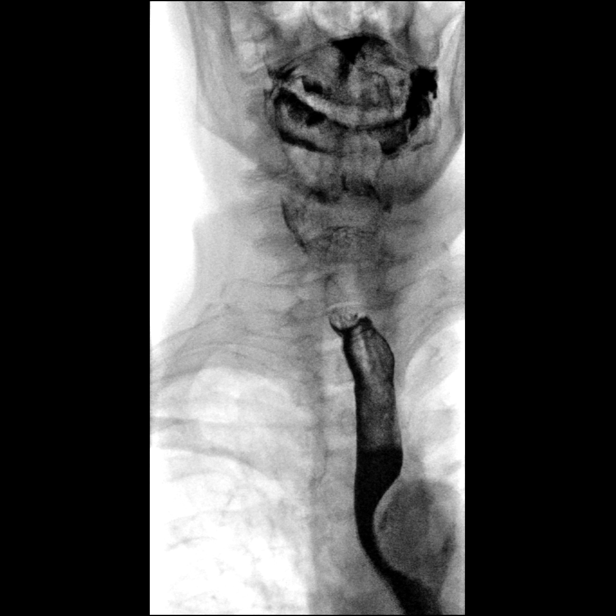

[Series 4: sequence · 2 of 23 frames shown (3 of 8)]
[frame 4/23]
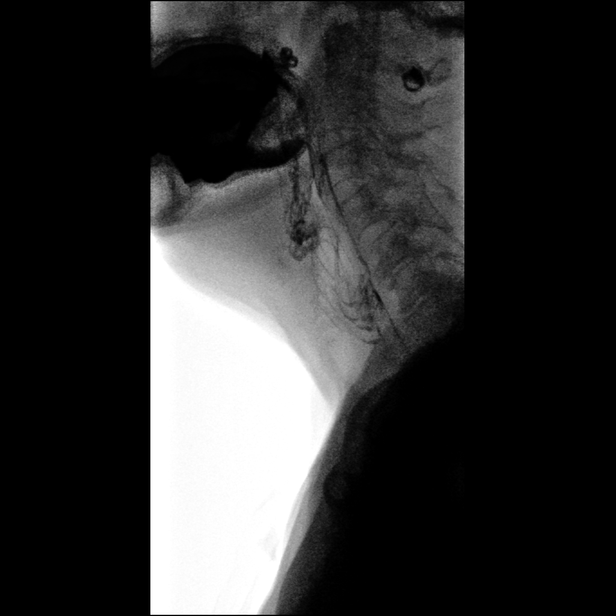
[frame 23/23]
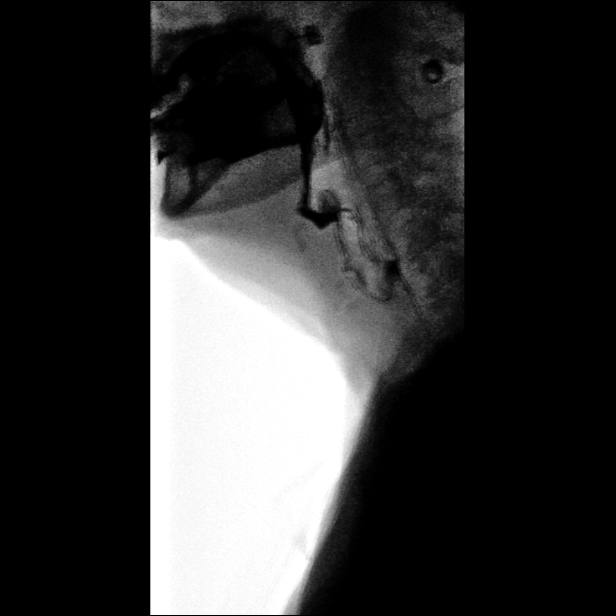

[Series 5: sequence · 2 of 35 frames shown (4 of 8)]
[frame 25/35]
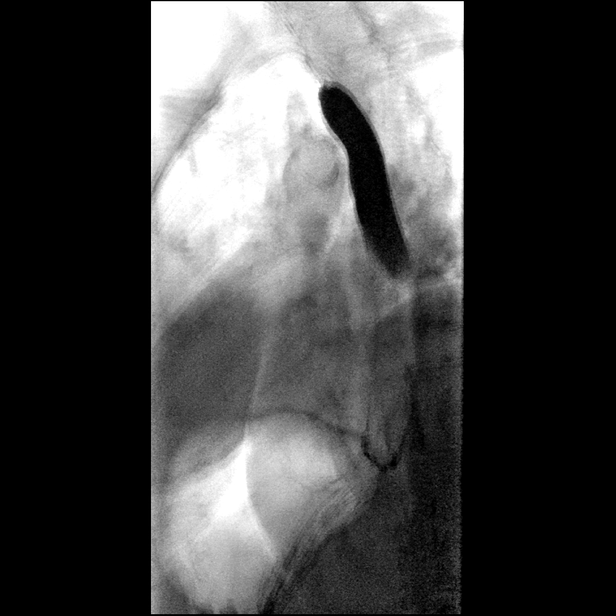
[frame 30/35]
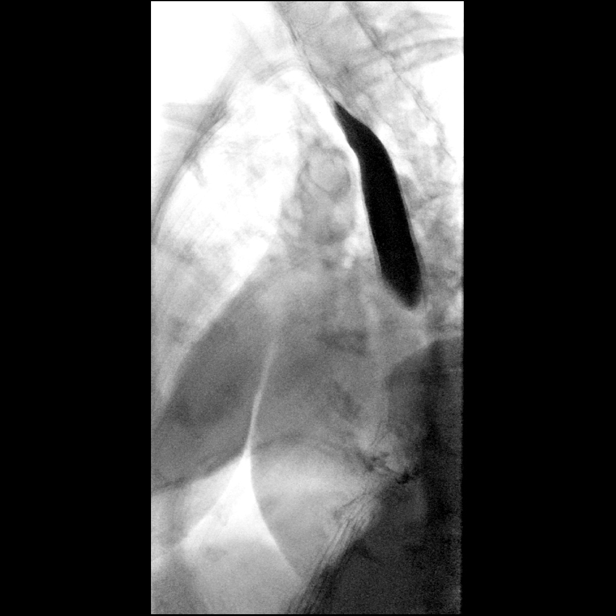

[Series 6: sequence · 1 of 27 frames shown (5 of 8)]
[frame 20/27]
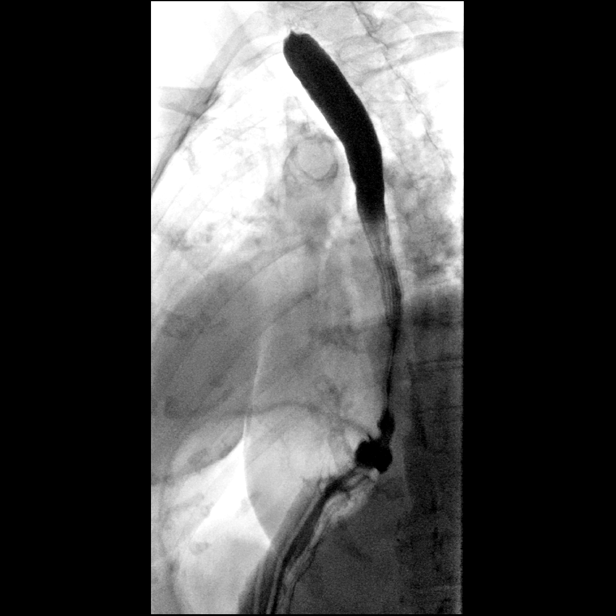

[Series 7: sequence · 1 of 14 frames shown (6 of 8)]
[frame 8/14]
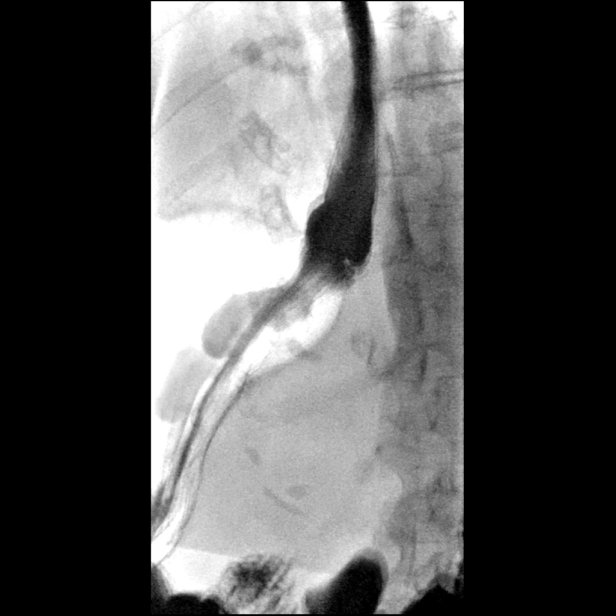

[Series 8: one shot · 1 of 1 slices shown]
[im 1/1]
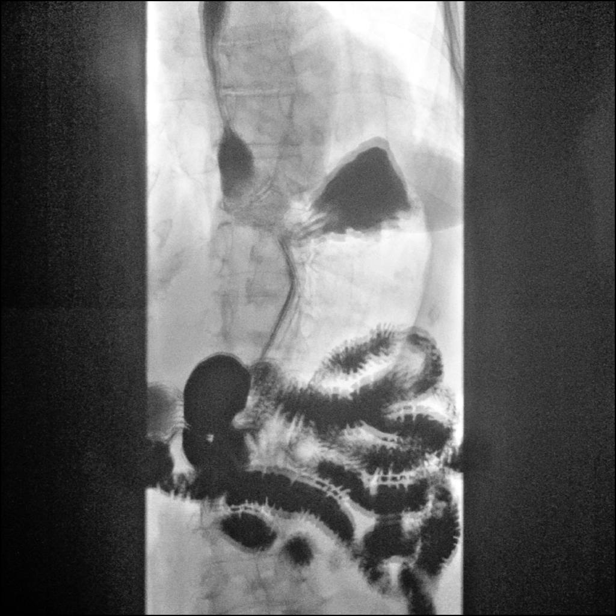

[Series 9: sequence · 2 of 10 frames shown (7 of 8)]
[frame 2/10]
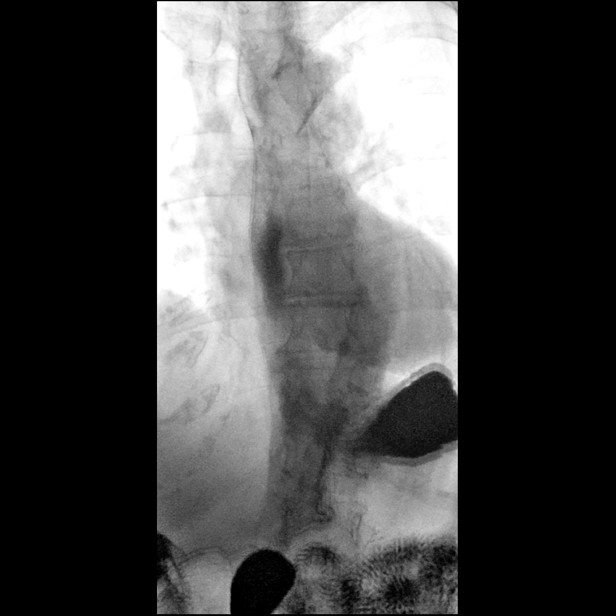
[frame 9/10]
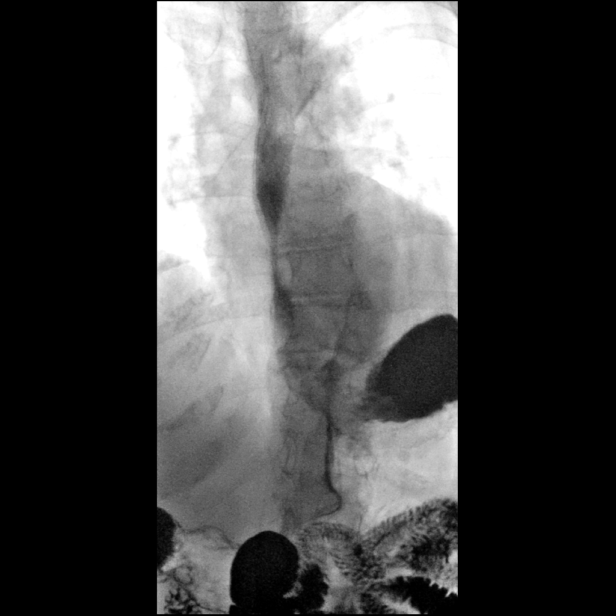

[Series 10: sequence · 1 of 5 frames shown (8 of 8)]
[frame 5/5]
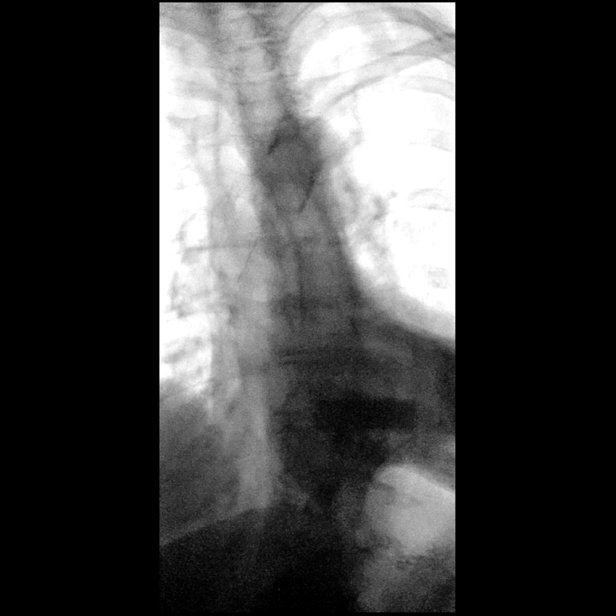

[14 of 24 positions shown; findings below may reference images not displayed]

FINDINGS: The patient swallowed the barium without difficulty. The esophageal
motility is normal. Rapid sequence imaging of the pharynx in the AP
and lateral projections demonstrates no mucosal abnormality or
laryngeal penetration.

There is a small reducible hiatal hernia associated with a non
restrictive distal esophageal ring. No evidence of esophageal mass,
stricture or ulceration. Only trace reflux was elicited with the
water siphon test.

A 13 mm barium tablet was administered and passed without delay into
the stomach.

Diffuse aortic atherosclerosis noted.
IMPRESSION: 1. No significant abnormalities identified. There is no evidence of
esophageal stricture or inflammation.
2. Trace gastroesophageal reflux.

## 2022-09-15 DIAGNOSIS — R2689 Other abnormalities of gait and mobility: Secondary | ICD-10-CM | POA: Diagnosis not present

## 2022-09-15 DIAGNOSIS — R2681 Unsteadiness on feet: Secondary | ICD-10-CM | POA: Diagnosis not present

## 2022-09-15 DIAGNOSIS — M158 Other polyosteoarthritis: Secondary | ICD-10-CM | POA: Diagnosis not present

## 2022-09-17 DIAGNOSIS — M158 Other polyosteoarthritis: Secondary | ICD-10-CM | POA: Diagnosis not present

## 2022-09-17 DIAGNOSIS — R2681 Unsteadiness on feet: Secondary | ICD-10-CM | POA: Diagnosis not present

## 2022-09-17 DIAGNOSIS — R2689 Other abnormalities of gait and mobility: Secondary | ICD-10-CM | POA: Diagnosis not present

## 2022-09-23 DIAGNOSIS — R2689 Other abnormalities of gait and mobility: Secondary | ICD-10-CM | POA: Diagnosis not present

## 2022-09-23 DIAGNOSIS — R2681 Unsteadiness on feet: Secondary | ICD-10-CM | POA: Diagnosis not present

## 2022-09-23 DIAGNOSIS — M158 Other polyosteoarthritis: Secondary | ICD-10-CM | POA: Diagnosis not present

## 2022-09-24 DIAGNOSIS — R2681 Unsteadiness on feet: Secondary | ICD-10-CM | POA: Diagnosis not present

## 2022-09-24 DIAGNOSIS — R2689 Other abnormalities of gait and mobility: Secondary | ICD-10-CM | POA: Diagnosis not present

## 2022-09-24 DIAGNOSIS — M158 Other polyosteoarthritis: Secondary | ICD-10-CM | POA: Diagnosis not present

## 2022-09-28 DIAGNOSIS — R2681 Unsteadiness on feet: Secondary | ICD-10-CM | POA: Diagnosis not present

## 2022-09-28 DIAGNOSIS — M158 Other polyosteoarthritis: Secondary | ICD-10-CM | POA: Diagnosis not present

## 2022-09-28 DIAGNOSIS — R2689 Other abnormalities of gait and mobility: Secondary | ICD-10-CM | POA: Diagnosis not present

## 2022-09-29 DIAGNOSIS — I10 Essential (primary) hypertension: Secondary | ICD-10-CM | POA: Diagnosis not present

## 2022-09-30 DIAGNOSIS — R2681 Unsteadiness on feet: Secondary | ICD-10-CM | POA: Diagnosis not present

## 2022-09-30 DIAGNOSIS — R2689 Other abnormalities of gait and mobility: Secondary | ICD-10-CM | POA: Diagnosis not present

## 2022-09-30 DIAGNOSIS — M158 Other polyosteoarthritis: Secondary | ICD-10-CM | POA: Diagnosis not present

## 2022-10-01 DIAGNOSIS — Z1389 Encounter for screening for other disorder: Secondary | ICD-10-CM | POA: Diagnosis not present

## 2022-10-01 DIAGNOSIS — M810 Age-related osteoporosis without current pathological fracture: Secondary | ICD-10-CM | POA: Diagnosis not present

## 2022-10-01 DIAGNOSIS — E663 Overweight: Secondary | ICD-10-CM | POA: Diagnosis not present

## 2022-10-01 DIAGNOSIS — I10 Essential (primary) hypertension: Secondary | ICD-10-CM | POA: Diagnosis not present

## 2022-10-01 DIAGNOSIS — Z0000 Encounter for general adult medical examination without abnormal findings: Secondary | ICD-10-CM | POA: Diagnosis not present

## 2022-10-01 DIAGNOSIS — H04123 Dry eye syndrome of bilateral lacrimal glands: Secondary | ICD-10-CM | POA: Diagnosis not present

## 2022-10-05 DIAGNOSIS — R2681 Unsteadiness on feet: Secondary | ICD-10-CM | POA: Diagnosis not present

## 2022-10-05 DIAGNOSIS — R2689 Other abnormalities of gait and mobility: Secondary | ICD-10-CM | POA: Diagnosis not present

## 2022-10-05 DIAGNOSIS — M158 Other polyosteoarthritis: Secondary | ICD-10-CM | POA: Diagnosis not present

## 2022-10-07 DIAGNOSIS — R2681 Unsteadiness on feet: Secondary | ICD-10-CM | POA: Diagnosis not present

## 2022-10-07 DIAGNOSIS — M158 Other polyosteoarthritis: Secondary | ICD-10-CM | POA: Diagnosis not present

## 2022-10-07 DIAGNOSIS — R2689 Other abnormalities of gait and mobility: Secondary | ICD-10-CM | POA: Diagnosis not present

## 2022-10-12 DIAGNOSIS — H16223 Keratoconjunctivitis sicca, not specified as Sjogren's, bilateral: Secondary | ICD-10-CM | POA: Diagnosis not present

## 2022-10-14 DIAGNOSIS — R2681 Unsteadiness on feet: Secondary | ICD-10-CM | POA: Diagnosis not present

## 2022-10-14 DIAGNOSIS — M158 Other polyosteoarthritis: Secondary | ICD-10-CM | POA: Diagnosis not present

## 2022-10-14 DIAGNOSIS — R2689 Other abnormalities of gait and mobility: Secondary | ICD-10-CM | POA: Diagnosis not present

## 2023-02-03 ENCOUNTER — Ambulatory Visit: Payer: Medicare PPO | Admitting: Cardiovascular Disease

## 2023-03-29 ENCOUNTER — Ambulatory Visit: Payer: Medicare PPO | Attending: Cardiovascular Disease | Admitting: Cardiovascular Disease

## 2023-03-29 ENCOUNTER — Encounter: Payer: Self-pay | Admitting: Cardiovascular Disease

## 2023-03-29 VITALS — BP 150/70 | HR 68 | Ht 60.0 in | Wt 156.6 lb

## 2023-03-29 DIAGNOSIS — I10 Essential (primary) hypertension: Secondary | ICD-10-CM | POA: Diagnosis not present

## 2023-03-29 DIAGNOSIS — I471 Supraventricular tachycardia, unspecified: Secondary | ICD-10-CM

## 2023-03-29 DIAGNOSIS — E785 Hyperlipidemia, unspecified: Secondary | ICD-10-CM

## 2023-03-29 NOTE — Assessment & Plan Note (Signed)
 History of PSVT in the past which has not occurred since I saw her last.

## 2023-03-29 NOTE — Assessment & Plan Note (Signed)
 History of dyslipidemia not on statin therapy with lipid profile performed 10/10/2021 revealing total cholesterol of 269, LDL of 175 and HDL 60.  Given her age, I do not feel compelled to start a statin drug at this time.

## 2023-03-29 NOTE — Patient Instructions (Signed)

## 2023-03-29 NOTE — Progress Notes (Signed)
 03/29/2023 [name]   1929/12/11  [id number]  Primary Physician Suzzette Eth, MD (Inactive) Primary Cardiologist: Avanell Leigh MD Lathan Poke, [place], [place]  HPI:  Linda Gates is a 89 y.o.    mildly overweight widowed Caucasian female mother of 1 living child (1 deceased child), grandmother 5 grandchildren referred by Dr. Langston Pippins for ongoing cardiovascular care because of history of PSVT and hypertension.  I last saw her in the office 11/21/2021.  She is accompanied by one of her granddaughters Linda Gates.  She was previously a patient of Dr. Lindajean Res.   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  I last saw her in the office 07/24/2020.  She does have a history of treated hypertension and untreated hyperlipidemia.  She is had PSVT remotely and palpitations with PACs and PVCs.  She has no other cardiac risk factors other than a mother who died of a myocardial infarction at age 84.  She is never had a heart attack or stroke.    Since I saw her a year and a half ago she continues to do well.  She is somewhat limited by pain in her knee making ambulation somewhat difficult.  She denies chest pain or shortness of breath.  She has not had any palpitations since I saw her a year and a half ago.  She still drives short distances.   Current Meds  Medication Sig   Calcium 600-200 MG-UNIT tablet Take 1 tablet by mouth daily.   Cholecalciferol (VITAMIN D) 400 UNITS capsule Take 1,000 Units by mouth daily.   diltiazem  (CARDIZEM  CD) 180 MG 24 hr capsule Take 1 capsule (180 mg total) by mouth daily.   Magnesium 250 MG TABS Take 250 mg by mouth.   metoprolol (LOPRESSOR) 50 MG tablet Take 50 mg by mouth 2 (two) times daily. 1 1/2 tablets in the morning and 1 tablet in the evening   Probiotic Product (UP4 PROBIOTICS PO) Take by mouth.     No Known Allergies  Social History   Socioeconomic History   Marital status: Widowed    Spouse name: Not on  file   Number of children: Not on file   Years of education: Not on file   Highest education level: Not on file  Occupational History   Not on file  Tobacco Use   Smoking status: Never   Smokeless tobacco: Never  Substance and Sexual Activity   Alcohol use: No   Drug use: No   Sexual activity: Not on file  Other Topics Concern   Not on file  Social History Narrative   Not on file   Social Drivers of Health   Financial Resource Strain: Not on file  Food Insecurity: Not on file  Transportation Needs: Not on file  Physical Activity: Not on file  Stress: Not on file  Social Connections: Not on file  Intimate Partner Violence: Not on file     Review of Systems: General: negative for chills, fever, night sweats or weight changes.  Cardiovascular: negative for chest pain, dyspnea on exertion, edema, orthopnea, palpitations, paroxysmal nocturnal dyspnea or shortness of breath Dermatological: negative for rash Respiratory: negative for cough or wheezing Urologic: negative for hematuria Abdominal: negative for nausea, vomiting, diarrhea, bright red blood per rectum, melena, or hematemesis Neurologic: negative for visual changes, syncope, or dizziness All other systems reviewed and are otherwise negative except as noted above.    Blood pressure Aaron Aas)  150/70, pulse 68, height 5' (1.524 m), weight 156 lb 9.6 oz (71 kg), SpO2 91%.  General appearance: alert and no distress Neck: no adenopathy, no carotid bruit, no JVD, supple, symmetrical, trachea midline, and thyroid not enlarged, symmetric, no tenderness/mass/nodules Lungs: clear to auscultation bilaterally Heart: regular rate and rhythm, S1, S2 normal, no murmur, click, rub or gallop Extremities: extremities normal, atraumatic, no cyanosis or edema Pulses: 2+ and symmetric Skin: Skin color, texture, turgor normal. No rashes or lesions Neurologic: Grossly normal  EKG EKG Interpretation Date/Time:  Monday March 29 2023  15:48:24 EST Ventricular Rate:  68 PR Interval:  276 QRS Duration:  84 QT Interval:  438 QTC Calculation: 465 R Axis:   5  Text Interpretation: Sinus rhythm with 1st degree A-V block Possible Left atrial enlargement Possible Inferior infarct , age undetermined No previous ECGs available Confirmed by Lauro Portal [phone]) on 03/29/2023 3:52:18 PM    ASSESSMENT AND PLAN:   Benign essential HTN History of essential hypertension her blood pressure measured today at 150/70.  She is on metoprolol.  Paroxysmal supraventricular tachycardia (HCC) History of PSVT in the past which has not occurred since I saw her last.  Dyslipidemia History of dyslipidemia not on statin therapy with lipid profile performed 10/10/2021 revealing total cholesterol of 269, LDL of 175 and HDL 60.  Given her age, I do not feel compelled to start a statin drug at this time.     Avanell Leigh MD FACP,FACC,FAHA, [facility] 03/29/2023 3:59 PM

## 2023-03-29 NOTE — Assessment & Plan Note (Signed)
 History of essential hypertension her blood pressure measured today at 150/70.  She is on metoprolol.

## 2023-03-31 DIAGNOSIS — R0981 Nasal congestion: Secondary | ICD-10-CM | POA: Diagnosis not present

## 2023-03-31 DIAGNOSIS — K529 Noninfective gastroenteritis and colitis, unspecified: Secondary | ICD-10-CM | POA: Diagnosis not present

## 2023-03-31 DIAGNOSIS — L989 Disorder of the skin and subcutaneous tissue, unspecified: Secondary | ICD-10-CM | POA: Diagnosis not present

## 2023-04-06 DIAGNOSIS — D485 Neoplasm of uncertain behavior of skin: Secondary | ICD-10-CM | POA: Diagnosis not present

## 2023-04-06 DIAGNOSIS — L929 Granulomatous disorder of the skin and subcutaneous tissue, unspecified: Secondary | ICD-10-CM | POA: Diagnosis not present

## 2023-04-06 DIAGNOSIS — L820 Inflamed seborrheic keratosis: Secondary | ICD-10-CM | POA: Diagnosis not present

## 2023-07-20 DIAGNOSIS — I10 Essential (primary) hypertension: Secondary | ICD-10-CM | POA: Diagnosis not present

## 2023-07-30 DIAGNOSIS — M1711 Unilateral primary osteoarthritis, right knee: Secondary | ICD-10-CM | POA: Diagnosis not present

## 2023-08-16 DIAGNOSIS — I10 Essential (primary) hypertension: Secondary | ICD-10-CM | POA: Diagnosis not present

## 2023-08-16 DIAGNOSIS — E663 Overweight: Secondary | ICD-10-CM | POA: Diagnosis not present

## 2023-08-16 DIAGNOSIS — M810 Age-related osteoporosis without current pathological fracture: Secondary | ICD-10-CM | POA: Diagnosis not present

## 2023-08-25 DIAGNOSIS — I10 Essential (primary) hypertension: Secondary | ICD-10-CM | POA: Diagnosis not present

## 2023-09-16 DIAGNOSIS — I10 Essential (primary) hypertension: Secondary | ICD-10-CM | POA: Diagnosis not present

## 2023-09-16 DIAGNOSIS — M810 Age-related osteoporosis without current pathological fracture: Secondary | ICD-10-CM | POA: Diagnosis not present

## 2023-09-16 DIAGNOSIS — E663 Overweight: Secondary | ICD-10-CM | POA: Diagnosis not present

## 2023-09-24 DIAGNOSIS — I10 Essential (primary) hypertension: Secondary | ICD-10-CM | POA: Diagnosis not present

## 2023-10-04 ENCOUNTER — Emergency Department (HOSPITAL_BASED_OUTPATIENT_CLINIC_OR_DEPARTMENT_OTHER)
Admission: EM | Admit: 2023-10-04 | Discharge: 2023-10-04 | Disposition: A | Discharge status: Home / Self Care | Attending: Emergency Medicine | Admitting: Emergency Medicine

## 2023-10-04 ENCOUNTER — Emergency Department (HOSPITAL_BASED_OUTPATIENT_CLINIC_OR_DEPARTMENT_OTHER): Admitting: Radiology

## 2023-10-04 DIAGNOSIS — R002 Palpitations: Secondary | ICD-10-CM | POA: Diagnosis not present

## 2023-10-04 DIAGNOSIS — R079 Chest pain, unspecified: Secondary | ICD-10-CM | POA: Insufficient documentation

## 2023-10-04 DIAGNOSIS — Z79899 Other long term (current) drug therapy: Secondary | ICD-10-CM | POA: Diagnosis not present

## 2023-10-04 DIAGNOSIS — I10 Essential (primary) hypertension: Secondary | ICD-10-CM | POA: Insufficient documentation

## 2023-10-04 DIAGNOSIS — R030 Elevated blood-pressure reading, without diagnosis of hypertension: Secondary | ICD-10-CM

## 2023-10-04 DIAGNOSIS — I700 Atherosclerosis of aorta: Secondary | ICD-10-CM | POA: Insufficient documentation

## 2023-10-04 DIAGNOSIS — M954 Acquired deformity of chest and rib: Secondary | ICD-10-CM | POA: Diagnosis not present

## 2023-10-04 DIAGNOSIS — I517 Cardiomegaly: Secondary | ICD-10-CM | POA: Diagnosis not present

## 2023-10-04 LAB — TROPONIN T, HIGH SENSITIVITY
Troponin T High Sensitivity: <15 ng/L (ref 0–19)
Troponin T High Sensitivity: <15 ng/L (ref 0–19)

## 2023-10-04 LAB — COMPREHENSIVE METABOLIC PANEL WITH GFR
ALT: 15 U/L (ref 0–44)
AST: 17 U/L (ref 15–41)
Albumin: 4.1 g/dL (ref 3.5–5.0)
Alkaline Phosphatase: 65 U/L (ref 38–126)
Anion gap: 14 (ref 5–15)
BUN: 11 mg/dL (ref 8–23)
CO2: 23 mmol/L (ref 22–32)
Calcium: 10.6 mg/dL — ABNORMAL HIGH (ref 8.9–10.3)
Chloride: 103 mmol/L (ref 98–111)
Creatinine, Ser: 0.67 mg/dL (ref 0.44–1.00)
GFR, Estimated: >60 mL/min (ref >60)
Glucose, Bld: 110 mg/dL — ABNORMAL HIGH (ref 70–99)
Potassium: 4.1 mmol/L (ref 3.5–5.1)
Sodium: 140 mmol/L (ref 135–145)
Total Bilirubin: 0.4 mg/dL (ref 0.0–1.2)
Total Protein: 6.9 g/dL (ref 6.5–8.1)

## 2023-10-04 LAB — CBC WITH DIFFERENTIAL/PLATELET
Abs Immature Granulocytes: 0.02 K/uL (ref 0.00–0.07)
Basophils Absolute: 0 K/uL (ref 0.0–0.1)
Basophils Relative: 0 %
Eosinophils Absolute: 0.1 K/uL (ref 0.0–0.5)
Eosinophils Relative: 1 %
HCT: 43.6 % (ref 36.0–46.0)
Hemoglobin: 14.8 g/dL (ref 12.0–15.0)
Immature Granulocytes: 0 %
Lymphocytes Relative: 20 %
Lymphs Abs: 1.4 K/uL (ref 0.7–4.0)
MCH: 29.7 pg (ref 26.0–34.0)
MCHC: 33.9 g/dL (ref 30.0–36.0)
MCV: 87.4 fL (ref 80.0–100.0)
Monocytes Absolute: 0.6 K/uL (ref 0.1–1.0)
Monocytes Relative: 8 %
Neutro Abs: 4.9 K/uL (ref 1.7–7.7)
Neutrophils Relative %: 71 %
Platelets: 249 K/uL (ref 150–400)
RBC: 4.99 MIL/uL (ref 3.87–5.11)
RDW: 12.7 % (ref 11.5–15.5)
WBC: 6.9 K/uL (ref 4.0–10.5)
nRBC: 0 % (ref 0.0–0.2)

## 2023-10-04 LAB — RESP PANEL BY RT-PCR (RSV, FLU A&B, COVID)  RVPGX2
Influenza A by PCR: NEGATIVE
Influenza B by PCR: NEGATIVE
Resp Syncytial Virus by PCR: NEGATIVE
SARS Coronavirus 2 by RT PCR: NEGATIVE

## 2023-10-04 LAB — MAGNESIUM: Magnesium: 2.2 mg/dL (ref 1.7–2.4)

## 2023-10-04 LAB — TSH: TSH: 2.34 u[IU]/mL (ref 0.350–4.500)

## 2023-10-04 LAB — PRO BRAIN NATRIURETIC PEPTIDE: Pro Brain Natriuretic Peptide: 267 pg/mL (ref <300.0)

## 2023-10-04 NOTE — ED Provider Notes (Signed)
 [place] EMERGENCY DEPARTMENT AT [facility] Provider Note   CSN: [id number] Arrival date & time: 10/04/23  0848     Patient presents with: Palpitations   Linda FORMAN is a 89 y.o. female.   The history is provided by the patient and medical records. No language interpreter was used.  Palpitations Palpitations quality:  Fast Onset quality:  Unable to specify Duration:  3 days Timing:  Sporadic Progression:  Waxing and waning Chronicity:  Recurrent Context: not caffeine   Relieved by:  Nothing Worsened by:  Nothing Ineffective treatments:  None tried Associated symptoms: chest pain   Associated symptoms: no back pain, no cough, no dizziness, no malaise/fatigue, no nausea, no near-syncope, no numbness, no shortness of breath and no vomiting        Prior to Admission medications   Medication Sig Start Date End Date Taking? Authorizing Provider  Calcium 600-200 MG-UNIT tablet Take 1 tablet by mouth daily.    [provider]  Cholecalciferol (VITAMIN D) 400 UNITS capsule Take 1,000 Units by mouth daily.    [provider]  diltiazem  (CARDIZEM  CD) 180 MG 24 hr capsule Take 1 capsule (180 mg total) by mouth daily. 07/24/20   Court Dorn PARAS, MD  Magnesium 250 MG TABS Take 250 mg by mouth.    [provider]  metoprolol (LOPRESSOR) 50 MG tablet Take 50 mg by mouth 2 (two) times daily. 1 1/2 tablets in the morning and 1 tablet in the evening    [provider]  Probiotic Product (UP4 PROBIOTICS PO) Take by mouth.    [provider]    Allergies: Patient has no known allergies.    Review of Systems  Constitutional:  Negative for chills, fatigue, fever and malaise/fatigue.  HENT:  Negative for congestion.   Respiratory:  Negative for cough, chest tightness, shortness of breath and wheezing.   Cardiovascular:  Positive for chest pain and palpitations. Negative for near-syncope.  Gastrointestinal:  Negative for  abdominal pain, nausea and vomiting.  Genitourinary:  Negative for dysuria.  Musculoskeletal:  Negative for back pain and neck pain.  Skin:  Negative for rash and wound.  Neurological:  Negative for dizziness, light-headedness, numbness and headaches.  Psychiatric/Behavioral:  Negative for agitation and confusion.   All other systems reviewed and are negative.   Updated Vital Signs BP (!) 210/70   Pulse 64   Temp 98.3 F (36.8 C)   Resp (!) 21   SpO2 98%   Physical Exam Vitals and nursing note reviewed.  Constitutional:      General: She is not in acute distress.    Appearance: She is well-developed. She is not ill-appearing, toxic-appearing or diaphoretic.  HENT:     Head: Normocephalic and atraumatic.     Nose: No congestion or rhinorrhea.     Mouth/Throat:     Mouth: Mucous membranes are moist.     Pharynx: No oropharyngeal exudate or posterior oropharyngeal erythema.  Eyes:     Extraocular Movements: Extraocular movements intact.     Conjunctiva/sclera: Conjunctivae normal.     Pupils: Pupils are equal, round, and reactive to light.  Cardiovascular:     Rate and Rhythm: Normal rate and regular rhythm.     Pulses: Normal pulses.     Heart sounds: No murmur heard. Pulmonary:     Effort: Pulmonary effort is normal. No respiratory distress.     Breath sounds: Normal breath sounds. No wheezing, rhonchi or rales.  Chest:  Chest wall: No tenderness.  Abdominal:     General: Abdomen is flat.     Palpations: Abdomen is soft.     Tenderness: There is no abdominal tenderness.  Musculoskeletal:        General: No swelling or tenderness.     Cervical back: Neck supple. No tenderness.     Right lower leg: No edema.     Left lower leg: No edema.  Skin:    General: Skin is warm and dry.     Capillary Refill: Capillary refill takes less than 2 seconds.     Findings: No erythema or rash.  Neurological:     General: No focal deficit present.     Mental Status: She is  alert.     Sensory: No sensory deficit.     Motor: No weakness.  Psychiatric:        Mood and Affect: Mood normal.     (all labs ordered are listed, but only abnormal results are displayed) Labs Reviewed  COMPREHENSIVE METABOLIC PANEL WITH GFR - Abnormal; Notable for the following components:      Result Value   Glucose, Bld 110 (*)    Calcium 10.6 (*)    All other components within normal limits  RESP PANEL BY RT-PCR (RSV, FLU A&B, COVID)  [id number]  CBC WITH DIFFERENTIAL/PLATELET  TSH  PRO BRAIN NATRIURETIC PEPTIDE  MAGNESIUM  TROPONIN T, HIGH SENSITIVITY  TROPONIN T, HIGH SENSITIVITY    EKG: EKG Interpretation Date/Time:  Monday October 04 2023 08:58:14 EDT Ventricular Rate:  63 PR Interval:  220 QRS Duration:  98 QT Interval:  418 QTC Calculation: 428 R Axis:   -8  Text Interpretation: Sinus rhythm Prolonged PR interval Low voltage, precordial leads Abnormal R-wave progression, late transition when compared to prior, similar appearance No STEMI Confirmed by Ginger Barefoot ([id number]) on 10/04/2023 9:48:20 AM  Radiology: [facility] Chest 2 View Result Date: 10/04/2023 CLINICAL DATA:  Intermittent palpitations, left-sided chest discomfort down the left arm. EXAM: CHEST - 2 VIEW COMPARISON:  None Available. FINDINGS: Trachea is midline. Heart is enlarged. Thoracic aorta is calcified. Lungs are clear. No pleural fluid. Severe pectus deformity.  Degenerative changes in the spine. IMPRESSION: 1. No acute findings. 2.  Aortic atherosclerosis (ICD10-I70.0). Electronically Signed   By: Newell Eke M.D.   On: 10/04/2023 10:42     Procedures   Medications Ordered in the ED - No data to display                                  Medical Decision Making Amount and/or Complexity of Data Reviewed Labs: ordered. Radiology: ordered.    Linda Gates is a 89 y.o. female with a past medical history significant for hypertension, dyslipidemia, osteopenia, and previous paroxysmal SVT and  patient report of previous A-fib who presents with 5 days of intermittent left chest discomfort with fast palpitations and left arm discomfort.  Patient reports that over the last 5 days or so she has had episodes that come and go of fast palpitations with associated chest discomfort and pain going down her left arm.  She denies diaphoresis or significant shortness of breath.  Denies nausea or vomiting.  Reports no trauma.  Reports it comes and goes.  She denies any new medications or supplements or caffeine changes.  Denies any trauma.  Denies rash to suggest shingles.  Denies any abdominal pain back pain or flank  pain.  Denies syncope.  She reports her blood pressure has been all over the place and in high recently reports this is not abnormal for her.  She reports chronic headaches with no new changes or discomfort.  No neurological plaints reported.  No leg pain or leg swelling.  On exam, lungs clear.  Chest nontender.  Abdomen nontender.  Arm is nontender.  Intact pulses.  Legs nontender not critically edematous.  Back and flanks nontender.  Neck nontender.  Patient otherwise resting.  Pupils symmetric and reactive with normal extraocular wounds.  No evidence of focal neurologic deficit.  We had a shared decision-making conversation on management.  We agreed to get labs, chest x-ray, and cardiac workup given the left chest discomfort that comes and goes with pain going down the left arm.  With her lack of new headache and only her chronic mild headaches, we agreed to hold on head CT at this time.  Blood pressure was quite elevated.  Will get screening labs and workup and determine disposition based on workup results.  Her EKG on arrival does show sinus rhythm with no evidence of A-fib or a flutter or SVT.  2:02 PM Workup is returned overall reassuring.  Troponin negative x 2, blood pressure now down to 140 systolic.  BNP not elevated.  Other than calcium being slightly higher for which she take  supplementation, everything else is reassuring.  Patient was observed on the monitor for over 5 hours without significant arrhythmias.  Patient continues to feel well and would like to follow-up with her outpatient cardiology.  We did offer to consult cardiology here but patient would rather go home.  She will call her cardiology team and PCP for discussion about blood pressure management titration and close follow-up with the palpitations and she agrees with this plan.  Patient will be discharged for outpatient follow-up.      Final diagnoses:  Palpitations  Elevated blood pressure reading    ED Discharge Orders     None      Clinical Impression: 1. Palpitations   2. Elevated blood pressure reading     Disposition: Discharge  Condition: Good  I have discussed the results, Dx and Tx plan with the pt(& family if present). He/she/they expressed understanding and agree(s) with the plan. Discharge instructions discussed at great length. Strict return precautions discussed and pt &/or family have verbalized understanding of the instructions. No further questions at time of discharge.    New Prescriptions   No medications on file    Follow Up: [facility] @ [address] [place] [address] [phone]     yoru cardiologist     [facility] Emergency Department at [facility] [address] [place]  [id number] [phone]        Anissa Abbs, Lonni PARAS, MD 10/04/23 [phone]

## 2023-10-04 NOTE — Discharge Instructions (Signed)
 Your history, exam, and evaluation today did not reveal other significant findings today in the labs or imaging.  Your blood pressure improved throughout your visit and we saw no further arrhythmias on the monitor for over 5 hours of observation.  We feel you are safe for discharge home and agree with plan to have you follow-up with outpatient cardiology.  We did offer to consult them however you agree to call them for outpatient follow-up with them and your PCP.  Please rest and stay hydrated.  If any symptoms change or worsen acutely, please return to the nearest emergency department.

## 2023-10-04 NOTE — ED Triage Notes (Signed)
 C/o palpations and left arm pain x 3 days. Hx of afib. Takes rate control meds but no thinner.

## 2023-10-05 DIAGNOSIS — E785 Hyperlipidemia, unspecified: Secondary | ICD-10-CM | POA: Diagnosis not present

## 2023-10-05 DIAGNOSIS — I10 Essential (primary) hypertension: Secondary | ICD-10-CM | POA: Diagnosis not present

## 2023-10-05 DIAGNOSIS — R7303 Prediabetes: Secondary | ICD-10-CM | POA: Diagnosis not present

## 2023-10-05 DIAGNOSIS — Z0000 Encounter for general adult medical examination without abnormal findings: Secondary | ICD-10-CM | POA: Diagnosis not present

## 2023-10-05 DIAGNOSIS — Z23 Encounter for immunization: Secondary | ICD-10-CM | POA: Diagnosis not present

## 2023-10-05 DIAGNOSIS — M810 Age-related osteoporosis without current pathological fracture: Secondary | ICD-10-CM | POA: Diagnosis not present

## 2023-10-05 DIAGNOSIS — I471 Supraventricular tachycardia, unspecified: Secondary | ICD-10-CM | POA: Diagnosis not present

## 2023-10-17 DIAGNOSIS — E663 Overweight: Secondary | ICD-10-CM | POA: Diagnosis not present

## 2023-10-17 DIAGNOSIS — M810 Age-related osteoporosis without current pathological fracture: Secondary | ICD-10-CM | POA: Diagnosis not present

## 2023-10-17 DIAGNOSIS — I10 Essential (primary) hypertension: Secondary | ICD-10-CM | POA: Diagnosis not present

## 2023-10-22 NOTE — Progress Notes (Signed)
 Chart was reviewed and patient was presenting with chest pain and palpitations concerning for a cardiac etiology.  BNP was ordered to look for heart failure as contributing to her symptoms.

## 2023-10-24 DIAGNOSIS — I10 Essential (primary) hypertension: Secondary | ICD-10-CM | POA: Diagnosis not present

## 2023-11-16 DIAGNOSIS — E663 Overweight: Secondary | ICD-10-CM | POA: Diagnosis not present

## 2023-11-16 DIAGNOSIS — M810 Age-related osteoporosis without current pathological fracture: Secondary | ICD-10-CM | POA: Diagnosis not present

## 2023-11-16 DIAGNOSIS — I10 Essential (primary) hypertension: Secondary | ICD-10-CM | POA: Diagnosis not present

## 2023-11-23 DIAGNOSIS — I10 Essential (primary) hypertension: Secondary | ICD-10-CM | POA: Diagnosis not present

## 2023-12-17 DIAGNOSIS — I10 Essential (primary) hypertension: Secondary | ICD-10-CM | POA: Diagnosis not present

## 2023-12-17 DIAGNOSIS — E663 Overweight: Secondary | ICD-10-CM | POA: Diagnosis not present

## 2023-12-17 DIAGNOSIS — M810 Age-related osteoporosis without current pathological fracture: Secondary | ICD-10-CM | POA: Diagnosis not present
